# Patient Record
Sex: Female | Born: 1965
Health system: Southern US, Community
[De-identification: ages and names within clinical notes are randomized; demographics above are authoritative.]

## PROBLEM LIST (undated history)

## (undated) DIAGNOSIS — U071 COVID-19: Secondary | ICD-10-CM

## (undated) HISTORY — PX: ABDOMINAL HYSTERECTOMY: SHX81

## (undated) HISTORY — PX: TUBAL LIGATION: SHX77

## (undated) HISTORY — PX: WRIST SURGERY: SHX841

---

## 2014-01-06 ENCOUNTER — Emergency Department (HOSPITAL_COMMUNITY)
Admission: EM | Admit: 2014-01-06 | Discharge: 2014-01-06 | Disposition: A | Discharge status: Home / Self Care | Payer: Self-pay | Attending: Emergency Medicine | Admitting: Emergency Medicine

## 2014-01-06 ENCOUNTER — Emergency Department (HOSPITAL_COMMUNITY): Payer: Self-pay

## 2014-01-06 ENCOUNTER — Encounter (HOSPITAL_COMMUNITY): Payer: Self-pay | Admitting: Emergency Medicine

## 2014-01-06 DIAGNOSIS — R1084 Generalized abdominal pain: Secondary | ICD-10-CM | POA: Insufficient documentation

## 2014-01-06 DIAGNOSIS — M791 Myalgia: Secondary | ICD-10-CM | POA: Insufficient documentation

## 2014-01-06 DIAGNOSIS — R112 Nausea with vomiting, unspecified: Secondary | ICD-10-CM | POA: Insufficient documentation

## 2014-01-06 DIAGNOSIS — Z791 Long term (current) use of non-steroidal anti-inflammatories (NSAID): Secondary | ICD-10-CM | POA: Insufficient documentation

## 2014-01-06 DIAGNOSIS — R197 Diarrhea, unspecified: Secondary | ICD-10-CM | POA: Insufficient documentation

## 2014-01-06 DIAGNOSIS — Z3202 Encounter for pregnancy test, result negative: Secondary | ICD-10-CM | POA: Insufficient documentation

## 2014-01-06 DIAGNOSIS — R109 Unspecified abdominal pain: Secondary | ICD-10-CM

## 2014-01-06 LAB — URINALYSIS, ROUTINE W REFLEX MICROSCOPIC
Bilirubin Urine: NEGATIVE
Glucose, UA: NEGATIVE mg/dL
Hgb urine dipstick: NEGATIVE
Ketones, ur: NEGATIVE mg/dL
Leukocytes, UA: NEGATIVE
Nitrite: NEGATIVE
Protein, ur: NEGATIVE mg/dL
Specific Gravity, Urine: 1.009 (ref 1.005–1.030)
Urobilinogen, UA: 0.2 mg/dL (ref 0.0–1.0)
pH: 6.5 (ref 5.0–8.0)

## 2014-01-06 LAB — COMPREHENSIVE METABOLIC PANEL
ALT: 22 U/L (ref 0–35)
AST: 33 U/L (ref 0–37)
Albumin: 4 g/dL (ref 3.5–5.2)
Alkaline Phosphatase: 117 U/L (ref 39–117)
Anion gap: 12 (ref 5–15)
BUN: 21 mg/dL (ref 6–23)
CO2: 27 mEq/L (ref 19–32)
Calcium: 9.4 mg/dL (ref 8.4–10.5)
Chloride: 102 mEq/L (ref 96–112)
Creatinine, Ser: 0.76 mg/dL (ref 0.50–1.10)
GFR calc Af Amer: >90 mL/min (ref >90)
GFR calc non Af Amer: >90 mL/min (ref >90)
Glucose, Bld: 125 mg/dL — ABNORMAL HIGH (ref 70–99)
Potassium: 4 mEq/L (ref 3.7–5.3)
Sodium: 141 mEq/L (ref 137–147)
Total Bilirubin: 0.3 mg/dL (ref 0.3–1.2)
Total Protein: 7.3 g/dL (ref 6.0–8.3)

## 2014-01-06 LAB — CBC WITH DIFFERENTIAL/PLATELET
Basophils Absolute: 0 10*3/uL (ref 0.0–0.1)
Basophils Relative: 1 % (ref 0–1)
Eosinophils Absolute: 0.1 10*3/uL (ref 0.0–0.7)
Eosinophils Relative: 2 % (ref 0–5)
HCT: 41.2 % (ref 36.0–46.0)
Hemoglobin: 14.2 g/dL (ref 12.0–15.0)
Lymphocytes Relative: 14 % (ref 12–46)
Lymphs Abs: 0.9 10*3/uL (ref 0.7–4.0)
MCH: 28.7 pg (ref 26.0–34.0)
MCHC: 34.5 g/dL (ref 30.0–36.0)
MCV: 83.4 fL (ref 78.0–100.0)
Monocytes Absolute: 0.3 10*3/uL (ref 0.1–1.0)
Monocytes Relative: 4 % (ref 3–12)
Neutro Abs: 5.3 10*3/uL (ref 1.7–7.7)
Neutrophils Relative %: 79 % — ABNORMAL HIGH (ref 43–77)
Platelets: 210 10*3/uL (ref 150–400)
RBC: 4.94 MIL/uL (ref 3.87–5.11)
RDW: 12.2 % (ref 11.5–15.5)
WBC: 6.6 10*3/uL (ref 4.0–10.5)

## 2014-01-06 LAB — LIPASE, BLOOD: Lipase: 28 U/L (ref 11–59)

## 2014-01-06 LAB — POC URINE PREG, ED: Preg Test, Ur: NEGATIVE

## 2014-01-06 MED ORDER — ONDANSETRON HCL 4 MG/2ML IJ SOLN
4.0000 mg | Freq: Once | INTRAMUSCULAR | Status: AC
  Administered 2014-01-06: 4 mg via INTRAVENOUS
  Filled 2014-01-06: qty 2

## 2014-01-06 MED ORDER — IOHEXOL 300 MG/ML  SOLN
100.0000 mL | Freq: Once | INTRAMUSCULAR | Status: AC | PRN
  Administered 2014-01-06: 100 mL via INTRAVENOUS

## 2014-01-06 MED ORDER — IOHEXOL 300 MG/ML  SOLN
25.0000 mL | INTRAMUSCULAR | Status: AC | PRN
  Administered 2014-01-06 (×2): 25 mL via ORAL

## 2014-01-06 MED ORDER — ONDANSETRON HCL 4 MG PO TABS: 4.0000 mg | ORAL_TABLET | Freq: Four times a day (QID) | ORAL | Status: DC

## 2014-01-06 MED ORDER — SODIUM CHLORIDE 0.9 % IV BOLUS (SEPSIS)
1000.0000 mL | Freq: Once | INTRAVENOUS | Status: AC
  Administered 2014-01-06: 1000 mL via INTRAVENOUS

## 2014-01-06 NOTE — ED Provider Notes (Signed)
CSN: [id number]     Arrival date & time 01/06/14  [id number] History   First MD Initiated Contact with Patient 01/06/14 [phone]     Chief Complaint  Patient presents with  . Emesis  . Abdominal Pain     (Consider location/radiation/quality/duration/timing/severity/associated sxs/prior Treatment) HPI Comments: Patient presents today with a chief complaint of nausea, vomiting, diarrhea, and abdominal pain.  She reports that she began having symptoms this morning.  She reports approximately 4-5 episodes of vomiting and is unsure how many episodes of diarrhea.  She denies any blood in her emesis or blood in the stool.  Patient reports that her abdominal pain is located in the epigastric area and does not radiate.  She is unable to further describe the pain.  She has not taken anything for pain prior to arrival.  She is also complaining of generalized body aches.  No fever, chills, chest pain, cough, constipation, or urinary symptoms.  No known sick contacts.     Patient speaks Punong.  Called [employer] phone line, but no interpretor was available in this language.  Patient speaks some Viatnamese so [employer] CommunicationsViatnamese phone interpreter used.    The history is provided by the patient. The history is limited by a language barrier. A language interpreter was used.    History reviewed. No pertinent past medical history. History reviewed. No pertinent past surgical history. History reviewed. No pertinent family history. History  Substance Use Topics  . Smoking status: Not on file  . Smokeless tobacco: Not on file  . Alcohol Use: Not on file   OB History   Grav Para Term Preterm Abortions TAB SAB Ect Mult Living                 Review of Systems  All other systems reviewed and are negative.     Allergies  Review of patient's allergies indicates no known allergies.  Home Medications   Prior to Admission medications   Medication Sig Start Date End Date Taking? Authorizing Provider   naproxen sodium (ANAPROX) 220 MG tablet Take 220 mg by mouth 2 (two) times daily as needed (for pain).   Yes Historical Provider, MD   BP 138/81  Pulse 68  Temp(Src) 97.7 F (36.5 C) (Oral)  Resp 32  SpO2 100% Physical Exam  Constitutional: She appears well-developed and well-nourished.  HENT:  Head: Normocephalic and atraumatic.  Mouth/Throat: Oropharynx is clear and moist.  Neck: Normal range of motion. Neck supple.  Cardiovascular: Normal rate, regular rhythm and normal heart sounds.   Pulmonary/Chest: Effort normal and breath sounds normal.  Abdominal: Soft. Bowel sounds are normal. She exhibits no distension and no mass. There is tenderness. There is no rebound and no guarding.  Diffuse abdominal tenderness to palpation  Musculoskeletal: Normal range of motion.  Neurological: She is alert. She has normal strength. No cranial nerve deficit or sensory deficit.  Skin: Skin is warm and dry.  Psychiatric: She has a normal mood and affect.  Nursing note and vitals reviewed.   ED Course  Procedures (including critical care time) Labs Review Labs Reviewed  CBC WITH DIFFERENTIAL  COMPREHENSIVE METABOLIC PANEL  LIPASE, BLOOD    Imaging Review No results found.   EKG Interpretation None     11:32 AM Reassessed patient.  Abdomen:  Soft with tenderness to palpation of the RLQ.  No rebound or guarding.  Will order Abdominal CT to r/o Appendicitis. MDM   Final diagnoses:  None   Patient presents today with  abdominal pain, nausea, vomiting, and diarrhea.  Symptoms began this morning.  Language barrier is present.  Patient speaks Punong and there is no interpretor available in this language.  She does speak some [place]namese, so a [place]namese interpretor was used.  Patient is afebrile.  Labs today unremarkable.  Urine pregnancy negative.  UA negative.  CT abdomen/pelvis showing possible mild Colitis, but otherwise negative.  Pain and nausea controlled at time of discharge.  Patient  tolerating PO liquids.  Feel that the patient is stable for discharge.  Return precautions given.      Santiago GladHeather Keani Gotcher, PA-C 01/08/14 1707  Enid SkeensJoshua M Zavitz, MD 01/12/14 [phone]

## 2014-01-06 NOTE — ED Notes (Addendum)
PT has belly pain and vomiting starting today. 3 loose stools and headache. No history of this.

## 2014-01-06 NOTE — ED Notes (Signed)
Patient transported to CT 

## 2015-02-17 ENCOUNTER — Ambulatory Visit (INDEPENDENT_AMBULATORY_CARE_PROVIDER_SITE_OTHER): Payer: Self-pay | Admitting: Family Medicine

## 2015-02-17 VITALS — BP 102/68 | HR 64 | Temp 97.9°F | Resp 20 | Ht <= 58 in | Wt 127.1 lb

## 2015-02-17 DIAGNOSIS — K5900 Constipation, unspecified: Secondary | ICD-10-CM

## 2015-02-17 DIAGNOSIS — R1084 Generalized abdominal pain: Secondary | ICD-10-CM

## 2015-02-17 DIAGNOSIS — K2990 Gastroduodenitis, unspecified, without bleeding: Secondary | ICD-10-CM

## 2015-02-17 DIAGNOSIS — N841 Polyp of cervix uteri: Secondary | ICD-10-CM

## 2015-02-17 DIAGNOSIS — K2970 Gastritis, unspecified, without bleeding: Secondary | ICD-10-CM

## 2015-02-17 LAB — POCT WET + KOH PREP
Trich by wet prep: ABSENT
Yeast by KOH: ABSENT
Yeast by wet prep: ABSENT

## 2015-02-17 LAB — POCT CBC
Granulocyte percent: 73 %G (ref 37–80)
HCT, POC: 43.5 % (ref 37.7–47.9)
Hemoglobin: 14.9 g/dL (ref 12.2–16.2)
Lymph, poc: 1.9 (ref 0.6–3.4)
MCH, POC: 28.1 pg (ref 27–31.2)
MCHC: 34.3 g/dL (ref 31.8–35.4)
MCV: 81.8 fL (ref 80–97)
MID (cbc): 0.1 (ref 0–0.9)
MPV: 7.3 fL (ref 0–99.8)
POC Granulocyte: 5.6 (ref 2–6.9)
POC LYMPH PERCENT: 25.3 %L (ref 10–50)
POC MID %: 1.7 %M (ref 0–12)
Platelet Count, POC: 265 10*3/uL (ref 142–424)
RBC: 5.31 M/uL (ref 4.04–5.48)
RDW, POC: 12.7 %
WBC: 7.7 10*3/uL (ref 4.6–10.2)

## 2015-02-17 LAB — POCT URINALYSIS DIP (MANUAL ENTRY)
Bilirubin, UA: NEGATIVE
Blood, UA: NEGATIVE
Glucose, UA: NEGATIVE
Ketones, POC UA: NEGATIVE
Nitrite, UA: NEGATIVE
Protein Ur, POC: NEGATIVE
Spec Grav, UA: 1.02
Urobilinogen, UA: 0.2
pH, UA: 5.5

## 2015-02-17 LAB — POC MICROSCOPIC URINALYSIS (UMFC): Mucus: ABSENT

## 2015-02-17 LAB — HEMOCCULT GUIAC POC 1CARD (OFFICE)
Card #1 Date: 12112016
Fecal Occult Blood, POC: NEGATIVE

## 2015-02-17 MED ORDER — DICYCLOMINE HCL 10 MG PO CAPS: 10.0000 mg | ORAL_CAPSULE | Freq: Three times a day (TID) | ORAL | Status: DC

## 2015-02-17 MED ORDER — LACTULOSE 20 GM/30ML PO SOLN: 15.0000 mL | Freq: Two times a day (BID) | ORAL | Status: DC

## 2015-02-17 MED ORDER — GI COCKTAIL ~~LOC~~
30.0000 mL | Freq: Once | ORAL | Status: AC
  Administered 2015-02-17: 30 mL via ORAL

## 2015-02-17 NOTE — Patient Instructions (Signed)
Food Choices for Gastroesophageal Reflux Disease, Adult When you have gastroesophageal reflux disease (GERD), the foods you eat and your eating habits are very important. Choosing the right foods can help ease the discomfort of GERD. WHAT GENERAL GUIDELINES DO I NEED TO FOLLOW?  Choose fruits, vegetables, whole grains, low-fat dairy products, and low-fat meat, fish, and poultry.  Limit fats such as oils, salad dressings, butter, nuts, and avocado.  Keep a food diary to identify foods that cause symptoms.  Avoid foods that cause reflux. These may be different for different people.  Eat frequent small meals instead of three large meals each day.  Eat your meals slowly, in a relaxed setting.  Limit fried foods.  Cook foods using methods other than frying.  Avoid drinking alcohol.  Avoid drinking large amounts of liquids with your meals.  Avoid bending over or lying down until 2-3 hours after eating. WHAT FOODS ARE NOT RECOMMENDED? The following are some foods and drinks that may worsen your symptoms: Vegetables Tomatoes. Tomato juice. Tomato and spaghetti sauce. Chili peppers. Onion and garlic. Horseradish. Fruits Oranges, grapefruit, and lemon (fruit and juice). Meats High-fat meats, fish, and poultry. This includes hot dogs, ribs, ham, sausage, salami, and bacon. Dairy Whole milk and chocolate milk. Sour cream. Cream. Butter. Ice cream. Cream cheese.  Beverages Coffee and tea, with or without caffeine. Carbonated beverages or energy drinks. Condiments Hot sauce. Barbecue sauce.  Sweets/Desserts Chocolate and cocoa. Donuts. Peppermint and spearmint. Fats and Oils High-fat foods, including [place] fries and potato chips. Other Vinegar. Strong spices, such as black pepper, white pepper, red pepper, cayenne, curry powder, cloves, ginger, and chili powder. The items listed above may not be a complete list of foods and beverages to avoid. Contact your dietitian for more  information.   This information is not intended to replace advice given to you by your health care provider. Make sure you discuss any questions you have with your health care provider.   Document Released: 02/23/2005 Document Revised: 03/16/2014 Document Reviewed: 12/28/2012 Elsevier Interactive Patient Education 2016 Elsevier Inc.  Vim D? Dy, Ng??i L?n (Gastritis, Adult) Vim d? dy l hi?n t??ng ?au nh?c v s?ng (vim) ? l?p nim m?c d? dy. Vim d? dy c th? pht tri?n nh? l m?t tnh tr?ng kh?i pht ??t ng?t (c?p tnh) ho?c ko di (m?n tnh). N?u vim d? dy khng ???c ?i?u tr?, b?nh c th? d?n ??n ch?y mu v lot d? dy.  NGUYN NHN Vim d? dy xu?t hi?n khi l?p nim m?c d? dy y?u ho?c b? t?n th??ng. Sau ?, d?ch tiu ha t? d? dy gy vim nim m?c d? dy b? suy y?u. Nim m?c d? dy c th? y?u ho?c b? t?n th??ng do nhi?m vi rt ho?c vi khu?n. M?t nhi?m trng do vi khu?n ph? bi?n l nhi?m trng do Helicobacter pylori. Vim d? dy c?ng c th? do u?ng r??u qu nhi?u, dng m?t s? lo?i thu?c nh?t ??nh ho?c c qu nhi?u axit trong d? dy. TRI?U CH?NG Trong m?t s? tr??ng h?p, khng c tri?u ch?ng. Khi c tri?u ch?ng, chng c th? bao g?m:  ?au ho?c c?m gic nng rt ? vng b?ng trn.  Bu?n nn.  Nn.  C?m gic kh ch?u do no sau khi ?n. CH?N ?ON Chuyn gia ch?m [place] s?c kh?e c th? nghi ng? b?n b? vim d? dy d?a vo cc tri?u ch?ng c?a b?n v khm th?c th?. ?? xc ??nh nguyn nhn vim d? dy, chuyn gia ch?m  [place] s?c kh?e c th? th?c hi?n nh? sau:  Xt nghi?m mu ho?c phn ?? ki?m tra vi khu?n H pylori.  Soi d? dy. M?t ?ng m?ng, m?m (?n n?i soi) ???c lu?n xu?ng th?c qu?n v vo d? dy. ?n n?i soi c g?n ?n v camera ? ??u. Chuyn gia ch?m [place] s?c kh?e s? d?ng ?n n?i soi ?? xem bn trong d? dy.  L?y m?u m (sinh thi?t) t? d? dy ?? ki?m tra d??i knh hi?n vi. ?I?U TR? Ty thu?c vo nguyn nhn gy vim d? dy, thu?c c th? ???c k ??n. N?u b?n b? nhi?m trng do vi khu?n,  ch?ng h?n nh? nhi?m trng do H pylori, khng sinh c th? ???c cho dng. N?u vim d? dy l do c qu nhi?u axit trong d? dy, thu?c ch?n H2 ho?c thu?c trung ha axit c th? ???c cho dng. Chuyn gia ch?m [place] s?c kh?e c th? khuy?n ngh? b?n nn ng?ng dng atpirin, ibuprofen ho?c thu?c khng vim khng c steroid khc (NSAID). H??NG D?N CH?M [place] T?I NH  Ch? s? d?ng thu?c khng c?n k toa ho?c thu?c c?n k toa theo ch? d?n c?a chuyn gia ch?m [place] s?c kh?e.  N?u b?n ???c cho dng thu?c khng sinh, hy dng theo ch? d?n. Dng h?t thu?c ngay c? khi b?n b?t ??u c?m th?y ?? h?n.  U?ng ?? n??c ?? gi? cho n??c ti?u trong ho?c vng nh?t.  [place] cc lo?i th?c ph?m v ?? u?ng lm cho cc tri?u ch?ng t?i t? h?n, ch?ng h?n nh?:  Caffeine ho?c ?? u?ng c c?n.  S c la.  B?c h ho?c v? b?c h.  T?i v hnh ty.  Th?c ?n [place].  Tri cy h? cam, ch?ng h?n nh? cam, chanh hay chanh ty.  Cc th?c ?n c c chua, ch?ng h?n nh? n??c x?t, ?t, salsa (n??c x?t [place]) v bnh pizza.  Cc lo?i th?c ?n chin xo v nhi?u ch?t bo.  ?n nh?ng b?a ?n nh?, th??ng xuyn h?n thay v cc b?a ?n l?n. HY NGAY L?P T?C ?I KHM N?U:  Phn c mu ?en ho?c ?? s?m.  B?n nn ra mu ho?c ch?t gi?ng nh? b?t c ph.  B?n khng th? gi? ch?t l?ng trong ng??i.  B?n b? ?au b?ng tr?m tr?ng h?n.  B?n b? s?t.  B?n khng c?m th?y kh?e h?n sau 1 tu?n.  B?n c b?t c? cu h?i ho?c th?c m?c no khc. ??M B?O B?N:  Hi?u cc h??ng d?n ny.  S? theo di tnh tr?ng c?a mnh.  S? yu c?u tr? gip ngay l?p t?c n?u b?n c?m th?y khng ?? ho?c tnh tr?ng tr?m tr?ng h?n.   Thng tin ny khng nh?m m?c ?ch thay th? cho l?i khuyn m chuyn gia ch?m [place] s?c kh?e ni v?i qu v?. Hy b?o ??m qu v? ph?i th?o lu?n b?t k? v?n ?? g m qu v? c v?i chuyn gia ch?m [place] s?c kh?e c?a qu v?.   Document Released: 02/23/2005 Document Revised: 10/26/2012 Elsevier Interactive Patient Education 2016 [employer].  To bn, Ng??i  l?n (Constipation, Adult) To bn l khi m?t ng??i ?i ??i ti?n t h?n ba l?n trong m?t tu?n, ??i ti?n kh kh?n, ho?c ??i ti?n ra phn kh, c?ng, ho?c to h?n bnh th??ng. Khi tu?i cng cao th cng d? b? to bn. Ch? ?? ?n thi?u ch?t x?, khng u?ng ?? n??c, v dng m?t s? lo?i thu?c nh?t ??nh c th? lm to  bo n?ng thm.  NGUYN NHN.   M?t s? lo?i thu?c nh?t ??nh nh? thu?c ch?ng tr?m c?m, thu?c gi?m ?au, thu?c c b? sung s?t, thu?c trung ha axit d?ch v?, v thu?c l?i ti?u.  M?t s? b?nh l nh?t ??nh nh? ti?u ???ng, h?i ch?ng ru?t kch thch (IBS), b?nh c?a tuy?n gip, ho?c tr?m c?m.  Khng u?ng ?? n??c.  Khng ?n ?? th?c ?n giu ch?t x?.  C?ng th?ng ho?c do ?i l?i.  t ho?t ??ng thn th? ho?c th? d?c.  Nh?n ?i ??i ti?n.  S? d?ng qu nhi?u thu?c nhu?n trng. D?U HI?U V TRI?U CH?NG   ?i ??i ti?n t h?n ba l?n m?i tu?n.  Ph?i r?n m?nh ?? ??i ti?n.  ??i ti?n ra phn c?ng, kh, ho?c to h?n bnh th??ng.  C?m th?y ??y b?ng ho?c ch??ng b?ng.  ?au ? vng b?ng d??i.  Khng c?m th?y tho?i mi sau khi ??i ti?n. CH?N ?ON  Chuyn gia ch?m [place] s?c kh?e c?a qu v? s? h?i v? b?nh s? v khm th?c th? cho qu v?. C th? c?n ki?m tra thm trong tr??ng h?p to bn n?ng. M?t s? ki?m tra c th? bao g?m:  Ch?p X quang c ch?t c?n quang ?? ki?m tra tr?c trng, ??i trng, v ?i khi c? ru?t non c?a qu v?.  N?i soi tr?c trng sigma ?? ki?m tra ph?n pha d??i c?a ??i trng.  Th? thu?t soi ??i trng ?? khm ton b? ??i trng. ?I?U TR?  ?i?u tr? ty thu?c vo m?c ?? tr?m tr?ng c?a ch?ng to bn v nguyn nhn gy to bn. ?i?u tr? thng qua ch? ?? ?n u?ng bao g?m u?ng nhi?u n??c h?n v ?n th?c ?n c nhi?u ch?t x? h?n. ?i?u tr? thng qua l?i s?ng c th? bao g?m vi?c t?p th? d?c th??ng xuyn. N?u nh?ng khuy?n ngh? v? ch? ?? ?n v l?i s?ng khng c tc d?ng, chuyn gia ch?m [place] s?c kh?e c th? khuy?n ngh? qu v? dng cc lo?i thu?c nhu?n trng khng c?n k ??n ?? gip qu v? ??i ti?n. C th? ph?i k ??n  thu?c c?n k ??n n?u thu?c khng c?n k ??n khng c tc d?ng.  H??NG D?N CH?M [place] T?I NH   ?n th?c ?n c nhi?u ch?t x? nh? tri cy, rau, ng? c?c nguyn h?t, v cc lo?i ??u.  H?n ch? th?c ?n c nhi?u ch?t bo v ???ng ch? bi?n s?n, ch?ng h?n khoai ty chin, bnh hamburger, bnh quy, k?o, v soda.  C th? dng th?c ph?m ch?c n?ng c b? sung ch?t x? vo kh?u ph?n ?n c?a qu v? n?u qu v? khng th? dng ?? ch?t x? t? th?c ?n.  U?ng ?? n??c ?? gi? cho n??c ti?u trong ho?c vng nh?t.  T?p th? d?c th??ng xuyn ho?c theo ch? d?n c?a chuyn gia ch?m [place] s?c kh?e.  Vo nh v? sinh ngay khi qu v? c nhu c?u. Khng nh?n ?i ??i ti?n.  Ch? s? d?ng thu?c khng c?n k ??n ho?c thu?c c?n k ??n theo ch? d?n c?a chuyn gia ch?m [place] s?c kh?e.Khng dng cc lo?i thu?c ch?ng to bn khc m khng bn b?c tr??c v?i chuyn gia ch?m [place] s?c kh?e. NGAY L?P T?C ?I KHM N?U:   ?i ??i ti?n ra mu ?? t??i.  Ch?ng to bn ko di h?n 4 ngy v tr?m tr?ng h?n.  Qu v? b? ?au b?ng ho?c ?au tr?c trng.  Phn c?a qu  v? m?ng, trng nh? bt ch.  Qu v? b? s?t cn khng r nguyn nhn. ??M B?O QU V?:   Hi?u r cc h??ng d?n ny.  S? theo di tnh tr?ng c?a mnh.  S? yu c?u tr? gip ngay l?p t?c n?u qu v? c?m th?y khng kh?e ho?c th?y tr?m tr?ng h?n.   Thng tin ny khng nh?m m?c ?ch thay th? cho l?i khuyn m chuyn gia ch?m [place] s?c kh?e ni v?i qu v?. Hy b?o ??m qu v? ph?i th?o lu?n b?t k? v?n ?? g m qu v? c v?i chuyn gia ch?m [place] s?c kh?e c?a qu v?.   Document Released: 06/10/2010 Document Revised: 03/16/2014 Elsevier Interactive Patient Education [employer]

## 2015-02-17 NOTE — Progress Notes (Signed)
Subjective:  This chart was scribed for Theresa Sorenson, MD by [facility], medical scribe at [facility].The patient was seen in exam room 02 and the patient's care was started at 12:20 PM.   Patient ID: Theresa Weaver, female    DOB: [birth year], 49 y.o.   MRN: [id number] Chief Complaint  Patient presents with  . Abdominal Pain    x 2 weeks-Keep her up at night  . Diarrhea    yesterday  . Emesis  . Back Pain   HPI HPI Comments: Theresa Weaver is a 49 y.o. female who presents to [facility] because of constant, burning, and generalized abdominal pain that radiates to her back, onset two weeks ago. Associated nausea, vomiting, and diarrhea x2. Her appetite has decreased. Pain is unchanged after BM. Taking tylenol, ibuprofen, probiotic, gasx and nexium. She has only taken one nexium. No urinary symptoms or pelvic pain. Last pelvic exam was last year which was normal. No longer having menstrual periods, last was last year. Did have an episode of mild colitis seen on CT last year. symptomatically treated. Pt speaks Pun[place] and last year in the ED the [employer] interpret[employer] phone line did not have and interpretor available for this language.  History reviewed. No pertinent past medical history. Past Surgical History  Procedure Laterality Date  . Abdominal hysterectomy     No current outpatient prescriptions on file prior to visit.   No current facility-administered medications on file prior to visit.   No Known Allergies History reviewed. No pertinent family history. Social History   Social History  . Marital Status: Married    Spouse Name: N/A  . Number of Children: N/A  . Years of Education: N/A   Social History Main Topics  . Smoking status: Never Smoker   . Smokeless tobacco: Never Used  . Alcohol Use: No  . Drug Use: No  . Sexual Activity: Not Asked   Other Topics Concern  . None   Social History Narrative    Review of Systems  Constitutional:  Positive for appetite change.  Gastrointestinal: Positive for nausea, vomiting, abdominal pain and diarrhea.  Genitourinary: Negative for dysuria, frequency and pelvic pain.  Musculoskeletal: Positive for back pain.      Objective:  BP 102/68 mmHg  Pulse 64  Temp(Src) 97.9 F (36.6 C) (Oral)  Resp 20  Ht  (1.473 m)  Wt 127 lb 2 oz (57.664 kg)  BMI 26.58 kg/m2  SpO2 98% Physical Exam  Constitutional: She is oriented to person, place, and time. She appears well-developed and well-nourished. No distress.  HENT:  Head: Normocephalic and atraumatic.  Eyes: Pupils are equal, round, and reactive to light.  Neck: Normal range of motion.  Cardiovascular: Normal rate, regular rhythm and normal heart sounds.  Exam reveals no friction rub.   No murmur heard. Pulmonary/Chest: Effort normal and breath sounds normal. No respiratory distress.  Abdominal:  Positive left greater than right CVA tenderness. Hypoactive bowel sounds. Diffuse abdominal tenderness worse bilateral lower quadrant. No rebounding  Musculoskeletal: Normal range of motion.  Neurological: She is alert and oriented to person, place, and time.  Skin: Skin is warm and dry.  Psychiatric: She has a normal mood and affect. Her behavior is normal.  Nursing note and vitals reviewed.     Results for orders placed or performed in visit on 02/17/15  POCT urinalysis dipstick  Result Value Ref Range   Color, UA yellow yellow  Clarity, UA clear clear   Glucose, UA negative negative   Bilirubin, UA negative negative   Ketones, POC UA negative negative   Spec Grav, UA 1.020    Blood, UA negative negative   pH, UA 5.5    Protein Ur, POC negative negative   Urobilinogen, UA 0.2    Nitrite, UA Negative Negative   Leukocytes, UA Trace (A) Negative  POCT Microscopic Urinalysis ([facility])  Result Value Ref Range   WBC,UR,HPF,POC None None WBC/hpf   RBC,UR,HPF,POC None None RBC/hpf   Bacteria Few (A) None, Too numerous to count     Mucus Absent Absent   Epithelial Cells, UR Per Microscopy Few (A) None, Too numerous to count cells/hpf  POCT CBC  Result Value Ref Range   WBC 7.7 4.6 - 10.2 K/uL   Lymph, poc 1.9 0.6 - 3.4   POC LYMPH PERCENT 25.3 10 - 50 %L   MID (cbc) 0.1 0 - 0.9   POC MID % 1.7 0 - 12 %M   POC Granulocyte 5.6 2 - 6.9   Granulocyte percent 73.0 37 - 80 %G   RBC 5.31 4.04 - 5.48 M/uL   Hemoglobin 14.9 12.2 - 16.2 g/dL   HCT, POC 21.3 08.6 - 47.9 %   MCV 81.8 80 - 97 fL   MCH, POC 28.1 27 - 31.2 pg   MCHC 34.3 31.8 - 35.4 g/dL   RDW, POC 57.8 %   Platelet Count, POC 265 142 - 424 K/uL   MPV 7.3 0 - 99.8 fL  POCT Wet + KOH Prep  Result Value Ref Range   Yeast by KOH Absent Present, Absent   Yeast by wet prep Absent Present, Absent   WBC by wet prep Many (A) None, Few, Too numerous to count   Clue Cells Wet Prep HPF POC None None, Too numerous to count   Trich by wet prep Absent Present, Absent   Bacteria Wet Prep HPF POC Many (A) None, Few, Too numerous to count   Epithelial Cells By [employer] Pref ([facility]) Moderate (A) None, Few, Too numerous to count   RBC,UR,HPF,POC Many (A) None RBC/hpf  POCT occult blood stool  Result Value Ref Range   Fecal Occult Blood, POC Negative Negative   Card #1 Date [id number]    Card #2 Fecal Occult Blod, POC     Card #2 Date     Card #3 Fecal Occult Blood, POC     Card #3 Date      Assessment & Plan:   1. Generalized abdominal pain - suspect due to constipation, start miralax or lactulose. Trial of bentyl  2. Cervical polyp   3. Gastritis and gastroduodenitis   4. Constipation, unspecified constipation type    Language level caveat  Orders Placed This Encounter  Procedures  . POCT urinalysis dipstick  . POCT Microscopic Urinalysis ([facility])  .. t POCT CBC  . POCT Wet + KOH Prep  . POCT occult blood stool    Meds ordered this encounter  Medications  . esomeprazole (NEXIUM 24HR) 20 MG capsule    Sig: Take 20 mg by mouth daily at 12 noon.  .  Lactobacillus (EQL DIGESTIVE PROBIOTIC PO)    Sig: Take 1 capsule by mouth daily.  Marland Kitchen gi cocktail (Maalox,Lidocaine,Donnatal)    Sig:   . DISCONTD: Lactulose 20 GM/30ML SOLN    Sig: Take 15 mLs (10 g total) by mouth 2 (two) times daily. For constipation    Dispense:  300 mL  Refill:  0  . DISCONTD: dicyclomine (BENTYL) 10 MG capsule    Sig: Take 1 capsule (10 mg total) by mouth 4 (four) times daily -  before meals and at bedtime.    Dispense:  60 capsule    Refill:  0  . dicyclomine (BENTYL) 10 MG capsule    Sig: Take 1 capsule (10 mg total) by mouth 4 (four) times daily -  before meals and at bedtime.    Dispense:  60 capsule    Refill:  0  . Lactulose 20 GM/30ML SOLN    Sig: Take 15 mLs (10 g total) by mouth 2 (two) times daily. For constipation    Dispense:  600 mL    Refill:  0    I personally performed the services described in this documentation, which was scribed in my presence. The recorded information has been reviewed and considered, and addended by me as needed.  Theresa SorensonEva Shaw, MD MPH    By signing my name below, I, Nadim Abuhashem, attest that this documentation has been prepared under the direction and in the presence of Theresa SorensonEva Shaw, MD.  Electronically Signed: Conchita ParisNadim Abuhashem, medical scribe. 02/17/2015, 12:30 PM.

## 2016-08-10 ENCOUNTER — Ambulatory Visit (HOSPITAL_COMMUNITY)
Admission: RE | Admit: 2016-08-10 | Discharge: 2016-08-10 | Disposition: A | Discharge status: Home / Self Care | Payer: BLUE CROSS/BLUE SHIELD | Source: Ambulatory Visit | Attending: Emergency Medicine | Admitting: Emergency Medicine

## 2016-08-10 ENCOUNTER — Ambulatory Visit (INDEPENDENT_AMBULATORY_CARE_PROVIDER_SITE_OTHER): Payer: BLUE CROSS/BLUE SHIELD | Admitting: Emergency Medicine

## 2016-08-10 ENCOUNTER — Encounter: Payer: Self-pay | Admitting: Emergency Medicine

## 2016-08-10 ENCOUNTER — Telehealth: Payer: Self-pay | Admitting: Emergency Medicine

## 2016-08-10 VITALS — BP 140/86 | HR 67 | Temp 97.6°F | Resp 18 | Ht 58.07 in | Wt 135.4 lb

## 2016-08-10 DIAGNOSIS — R1032 Left lower quadrant pain: Secondary | ICD-10-CM | POA: Insufficient documentation

## 2016-08-10 DIAGNOSIS — R1084 Generalized abdominal pain: Secondary | ICD-10-CM

## 2016-08-10 DIAGNOSIS — K8020 Calculus of gallbladder without cholecystitis without obstruction: Secondary | ICD-10-CM | POA: Diagnosis not present

## 2016-08-10 DIAGNOSIS — R10813 Right lower quadrant abdominal tenderness: Secondary | ICD-10-CM | POA: Insufficient documentation

## 2016-08-10 DIAGNOSIS — R918 Other nonspecific abnormal finding of lung field: Secondary | ICD-10-CM | POA: Insufficient documentation

## 2016-08-10 LAB — POCT URINALYSIS DIP (MANUAL ENTRY)
Bilirubin, UA: NEGATIVE
Blood, UA: NEGATIVE
Glucose, UA: NEGATIVE mg/dL
Ketones, POC UA: NEGATIVE mg/dL
Leukocytes, UA: NEGATIVE
Nitrite, UA: NEGATIVE
Protein Ur, POC: NEGATIVE mg/dL
Spec Grav, UA: 1.02 (ref 1.010–1.025)
Urobilinogen, UA: 0.2 E.U./dL
pH, UA: 5.5 (ref 5.0–8.0)

## 2016-08-10 LAB — POCT CBC
Granulocyte percent: 64.2 %G (ref 37–80)
HCT, POC: 40.9 % (ref 37.7–47.9)
Hemoglobin: 14.6 g/dL (ref 12.2–16.2)
Lymph, poc: 1.8 (ref 0.6–3.4)
MCH, POC: 28.5 pg (ref 27–31.2)
MCHC: 35.6 g/dL — AB (ref 31.8–35.4)
MCV: 80.1 fL (ref 80–97)
MID (cbc): 0.3 (ref 0–0.9)
MPV: 7.6 fL (ref 0–99.8)
POC Granulocyte: 3.9 (ref 2–6.9)
POC LYMPH PERCENT: 30.8 %L (ref 10–50)
POC MID %: 5 %M (ref 0–12)
Platelet Count, POC: 255 10*3/uL (ref 142–424)
RBC: 5.11 M/uL (ref 4.04–5.48)
RDW, POC: 12.5 %
WBC: 6 10*3/uL (ref 4.6–10.2)

## 2016-08-10 LAB — POC MICROSCOPIC URINALYSIS (UMFC): Mucus: ABSENT

## 2016-08-10 LAB — COMPREHENSIVE METABOLIC PANEL
ALT: 25 IU/L (ref 0–32)
AST: 29 IU/L (ref 0–40)
Albumin/Globulin Ratio: 1.7 (ref 1.2–2.2)
Albumin: 4.7 g/dL (ref 3.5–5.5)
Alkaline Phosphatase: 127 IU/L — ABNORMAL HIGH (ref 39–117)
BUN/Creatinine Ratio: 16 (ref 9–23)
BUN: 14 mg/dL (ref 6–24)
Bilirubin Total: 0.5 mg/dL (ref 0.0–1.2)
CO2: 21 mmol/L (ref 18–29)
Calcium: 9.3 mg/dL (ref 8.7–10.2)
Chloride: 105 mmol/L (ref 96–106)
Creatinine, Ser: 0.88 mg/dL (ref 0.57–1.00)
GFR calc Af Amer: 87 mL/min/{1.73_m2} (ref >59)
GFR calc non Af Amer: 76 mL/min/{1.73_m2} (ref >59)
Globulin, Total: 2.7 g/dL (ref 1.5–4.5)
Glucose: 87 mg/dL (ref 65–99)
Potassium: 3.8 mmol/L (ref 3.5–5.2)
Sodium: 143 mmol/L (ref 134–144)
Total Protein: 7.4 g/dL (ref 6.0–8.5)

## 2016-08-10 MED ORDER — IOPAMIDOL (ISOVUE-300) INJECTION 61%
INTRAVENOUS | Status: AC
  Filled 2016-08-10: qty 100

## 2016-08-10 MED ORDER — IOPAMIDOL (ISOVUE-300) INJECTION 61%
100.0000 mL | Freq: Once | INTRAVENOUS | Status: AC | PRN
  Administered 2016-08-10: 100 mL via INTRAVENOUS

## 2016-08-10 NOTE — Telephone Encounter (Signed)
Spoke to both patient and daughter; CT A/P normal; advised to stay on clear liquid diet x 24 hours and advance as tolerated afterwards; Tylenol x pain and return if worse.

## 2016-08-10 NOTE — Patient Instructions (Signed)
GO TO [place] AT 3PM FOR CT SCAN  [address]  DRINK ONE BOTTLE AT 1PM  DRINK ONE BOTTLE AT 2PM

## 2016-08-10 NOTE — Progress Notes (Signed)
Theresa Weaver 51 y.o.   Chief Complaint  Patient presents with  . Abdominal Pain    X3 days- left side- nausea  . Dizziness    X 1 day with headache    HISTORY OF PRESENT ILLNESS: This is a 51 y.o. female complaining of abdominal pain since last Friday with nausea and vomiting.  Abdominal Pain  This is a new problem. The current episode started in the past 7 days. The onset quality is gradual. The problem occurs constantly. The problem has been waxing and waning. The pain is located in the LLQ, RLQ and epigastric region. The pain is at a severity of 4/10. The pain is moderate. The quality of the pain is dull and aching. The abdominal pain does not radiate. Associated symptoms include anorexia, diarrhea, a fever, nausea and vomiting. Pertinent negatives include no dysuria, frequency, headaches, hematochezia, hematuria, melena, myalgias or weight loss. There is no history of abdominal surgery, gallstones or pancreatitis.     Prior to Admission medications   Medication Sig Start Date End Date Taking? Authorizing Provider  dicyclomine (BENTYL) 10 MG capsule Take 1 capsule (10 mg total) by mouth 4 (four) times daily -  before meals and at bedtime. Patient not taking: Reported on 08/10/2016 02/17/15   Sherren MochaShaw, Eva N, MD  esomeprazole (NEXIUM 24HR) 20 MG capsule Take 20 mg by mouth daily at 12 noon.    [provider]  Lactobacillus (EQL DIGESTIVE PROBIOTIC PO) Take 1 capsule by mouth daily.    [provider]  Lactulose 20 GM/30ML SOLN Take 15 mLs (10 g total) by mouth 2 (two) times daily. For constipation Patient not taking: Reported on 08/10/2016 02/17/15   Sherren MochaShaw, Eva N, MD    No Known Allergies  There are no active problems to display for this patient.   History reviewed. No pertinent past medical history.  Past Surgical History:  Procedure Laterality Date  . ABDOMINAL HYSTERECTOMY      Social History   Social History  . Marital status: Married    Spouse name: N/A   . Number of children: N/A  . Years of education: N/A   Occupational History  . Not on file.   Social History Main Topics  . Smoking status: Never Smoker  . Smokeless tobacco: Never Used  . Alcohol use No  . Drug use: No  . Sexual activity: Not on file   Other Topics Concern  . Not on file   Social History Narrative  . No narrative on file    History reviewed. No pertinent family history.   Review of Systems  Constitutional: Positive for fever. Negative for weight loss.  HENT: Negative.   Eyes: Negative.   Respiratory: Negative.  Negative for cough and shortness of breath.   Cardiovascular: Negative.  Negative for chest pain, palpitations and leg swelling.  Gastrointestinal: Positive for abdominal pain, anorexia, diarrhea, nausea and vomiting. Negative for hematochezia and melena.  Genitourinary: Negative for dysuria, flank pain, frequency, hematuria and urgency.  Musculoskeletal: Negative for myalgias and neck pain.  Skin: Negative for rash.  Neurological: Negative for dizziness and headaches.  Endo/Heme/Allergies: Negative.   All other systems reviewed and are negative.    Physical Exam  Constitutional: She is oriented to person, place, and time. She appears well-developed and well-nourished.  HENT:  Head: Normocephalic and atraumatic.  Nose: Nose normal.  Mouth/Throat: Oropharynx is clear and moist.  Eyes: Conjunctivae and EOM are normal. Pupils are equal, round, and reactive to light.  Neck: Normal range of motion. Neck supple. No JVD present.  Cardiovascular: Normal rate, regular rhythm and normal heart sounds.   Pulmonary/Chest: Effort normal and breath sounds normal.  Abdominal: Soft. She exhibits no distension and no mass. There is tenderness (tender epigastric, LLQ, and RLQ). There is guarding (at RLQ). There is no rebound. No hernia.  Musculoskeletal: Normal range of motion.  Lymphadenopathy:    She has no cervical adenopathy.  Neurological: She is  alert and oriented to person, place, and time. No sensory deficit. She exhibits normal muscle tone.  Skin: Skin is warm and dry. Capillary refill takes less than 2 seconds.  Psychiatric: She has a normal mood and affect. Her behavior is normal.  Vitals reviewed.    ASSESSMENT & PLAN: [name] was seen today for abdominal pain and dizziness.  Diagnoses and all orders for this visit:  Left lower quadrant pain -     POCT urinalysis dipstick -     POCT Microscopic Urinalysis ([facility])  Generalized abdominal pain -     POCT CBC -     Comprehensive metabolic panel -     CT Abdomen Pelvis W Contrast; Future  Right lower quadrant abdominal tenderness without rebound tenderness   Patient Instructions  GO TO [facility] AT 3PM FOR CT SCAN  [address]  DRINK ONE BOTTLE AT 1PM  DRINK ONE BOTTLE AT 2PM   Edwina Barth, MD [facility] Vitals:   08/10/16 1100  BP: 140/86  Pulse: 67  Resp: 18  Temp: 97.6 F (36.4 C)   Recent Results (from the past 2160 hour(s))  POCT urinalysis dipstick     Status: None   Collection Time: 08/10/16 11:16 AM  Result Value Ref Range   Color, UA yellow yellow   Clarity, UA clear clear   Glucose, UA negative negative mg/dL   Bilirubin, UA negative negative   Ketones, POC UA negative negative mg/dL   Spec Grav, UA 1.610 9.604 - 1.025   Blood, UA negative negative   pH, UA 5.5 5.0 - 8.0   Protein Ur, POC negative negative mg/dL   Urobilinogen, UA 0.2 0.2 or 1.0 E.U./dL   Nitrite, UA Negative Negative   Leukocytes, UA Negative Negative  POCT Microscopic Urinalysis ([facility])     Status: Abnormal   Collection Time: 08/10/16 11:27 AM  Result Value Ref Range   WBC,UR,HPF,POC None None WBC/hpf   RBC,UR,HPF,POC None None RBC/hpf   Bacteria None None, Too numerous to count   Mucus Absent Absent   Epithelial Cells, UR Per Microscopy Few (A) None, Too numerous to count cells/hpf  POCT CBC     Status:  Abnormal   Collection Time: 08/10/16 11:30 AM  Result Value Ref Range   WBC 6.0 4.6 - 10.2 K/uL   Lymph, poc 1.8 0.6 - 3.4   POC LYMPH PERCENT 30.8 10 - 50 %L   MID (cbc) 0.3 0 - 0.9   POC MID % 5.0 0 - 12 %M   POC Granulocyte 3.9 2 - 6.9   Granulocyte percent 64.2 37 - 80 %G   RBC 5.11 4.04 - 5.48 M/uL   Hemoglobin 14.6 12.2 - 16.2 g/dL   HCT, POC 54.0 98.1 - 47.9 %   MCV 80.1 80 - 97 fL   MCH, POC 28.5 27 - 31.2 pg   MCHC 35.6 (A) 31.8 - 35.4 g/dL   RDW, POC 19.1 %   Platelet Count, POC 255 142 -  424 K/uL   MPV 7.6 0 - 99.8 fL

## 2016-08-12 ENCOUNTER — Other Ambulatory Visit: Payer: Self-pay | Admitting: Emergency Medicine

## 2016-08-12 DIAGNOSIS — K8020 Calculus of gallbladder without cholecystitis without obstruction: Secondary | ICD-10-CM

## 2016-08-15 NOTE — Progress Notes (Signed)
Unable to leave message

## 2016-09-02 ENCOUNTER — Encounter: Payer: Self-pay | Admitting: Radiology

## 2016-09-21 ENCOUNTER — Encounter: Payer: Self-pay | Admitting: Emergency Medicine

## 2016-10-17 ENCOUNTER — Ambulatory Visit (INDEPENDENT_AMBULATORY_CARE_PROVIDER_SITE_OTHER): Payer: BLUE CROSS/BLUE SHIELD | Admitting: Family Medicine

## 2016-10-17 ENCOUNTER — Ambulatory Visit (INDEPENDENT_AMBULATORY_CARE_PROVIDER_SITE_OTHER): Payer: BLUE CROSS/BLUE SHIELD

## 2016-10-17 ENCOUNTER — Encounter: Payer: Self-pay | Admitting: Family Medicine

## 2016-10-17 VITALS — BP 126/77 | HR 70 | Temp 97.5°F | Resp 16 | Ht <= 58 in | Wt 134.6 lb

## 2016-10-17 DIAGNOSIS — R109 Unspecified abdominal pain: Secondary | ICD-10-CM

## 2016-10-17 DIAGNOSIS — M25552 Pain in left hip: Secondary | ICD-10-CM

## 2016-10-17 DIAGNOSIS — R1032 Left lower quadrant pain: Secondary | ICD-10-CM

## 2016-10-17 DIAGNOSIS — K8021 Calculus of gallbladder without cholecystitis with obstruction: Secondary | ICD-10-CM

## 2016-10-17 LAB — POCT CBC
Granulocyte percent: 61.5 %G (ref 37–80)
HCT, POC: 43.2 % (ref 37.7–47.9)
Hemoglobin: 14.7 g/dL (ref 12.2–16.2)
Lymph, poc: 1.7 (ref 0.6–3.4)
MCH, POC: 27.9 pg (ref 27–31.2)
MCHC: 34.1 g/dL (ref 31.8–35.4)
MCV: 81.8 fL (ref 80–97)
MID (cbc): 0.3 (ref 0–0.9)
MPV: 7.6 fL (ref 0–99.8)
POC Granulocyte: 3.2 (ref 2–6.9)
POC LYMPH PERCENT: 32.5 %L (ref 10–50)
POC MID %: 6 %M (ref 0–12)
Platelet Count, POC: 268 10*3/uL (ref 142–424)
RBC: 5.28 M/uL (ref 4.04–5.48)
RDW, POC: 12.2 %
WBC: 5.2 10*3/uL (ref 4.6–10.2)

## 2016-10-17 LAB — POC MICROSCOPIC URINALYSIS (UMFC)

## 2016-10-17 LAB — POCT URINE PREGNANCY: Preg Test, Ur: NEGATIVE

## 2016-10-17 NOTE — Patient Instructions (Addendum)
Your x-rays showed some stool (poop) and gas in the intestines. The blood tests and urine tests are normal. One other blood test when off to a commercial lab and the reports will be back in a few days. We will let you know the results of them.  I would like you to take some MiraLAX, one dose every day until your stool is a little on the loose side to clean out your intestines and see if that helps your pain. (We are giving samples of MiraLAX. You can buy more over-the-counter if necessary and use it from time to time if you are having more problems.  The CT scan of the abdomen that you had 2 months ago did show that you have some gallstones. Many people will have gallstones that do not cause any symptoms or problems. However if you start having more pain, in the right side of the abdomen especially, you may have to see a surgeon about the gallbladder. I do not think that is necessary at this time since your symptoms are on the left side.  The hip x-ray looks normal. I hope that cleaning out your intestine and removing some of the pressure from the lower part of the abdomen will help the hip pain also.   You can continue to take Tylenol extra strength 500 mg (acetaminophen) 2 pills 3 times daily if necessary for the pain.  Also you can take ibuprofen 200 mg (Motrin, Advil) 2 or 3 pills 3 times a day with food if necessary for pain.  If you're not doing better over the next 1-2 weeks please return, or if you doing worse at anytime.  Go to the emergency room for further treatment if necessary if you cannot get in to the office.    IF you received an x-ray today, you will receive an invoice from [facility]. Please contact [facility] at [phone] with questions or concerns regarding your invoice.   IF you received labwork today, you will receive an invoice from [facility]. Please contact [facility] at [phone] with questions or concerns regarding your invoice.   Our billing  staff will not be able to assist you with questions regarding bills from these companies.  You will be contacted with the lab results as soon as they are available. The fastest way to get your results is to activate your My Chart account. Instructions are located on the last page of this paperwork. If you have not heard from us regarding the results in 2 weeks, please contact this office.

## 2016-10-17 NOTE — Progress Notes (Signed)
Patient ID: Theresa Weaver, female    DOB: 01-06-1966  Age: 51 y.o. MRN: [id number]  Chief Complaint  Patient presents with  . Back Pain    x 1 wk/ pt was lifiting something heavy  . Abdominal Pain    x 1wk    Subjective:   51 year old [place] lady who is here with abdominal pain and flank pain over the last week. She also complains of hurting her left hip. 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 She does not know of any specific injury. She denies nausea or vomiting. Bowels been acting okay. No dysuria. She has had problems in June and had a CT scan of the abdomen. No acute findings were present, though she does have some asymmetry medical cholelithiasis. She said she maybe it had some fevers.  Current allergies, medications, problem list, past/family and social histories reviewed.  Objective:  BP 126/77   Pulse 70   Temp (!) 97.5 F (36.4 C) (Oral)   Resp 16   Ht 4\' 10"  (1.473 m)   Wt 134 lb 9.6 oz (61.1 kg)   SpO2 100%   BMI 28.13 kg/m   No acute distress. Chest clear to auscultation. Heart regular. Abdomen has diminished bowel sounds. Soft. Mild to moderate left lower quadrant tenderness along the sigmoid region. Very mild tenderness right upper quadrant on deep palpation. Mild left CVA tenderness. Mild tenderness of the very low lumbar spine. Good range of motion of the hip with no crepitance noted.  Hold report reviewed. Urinalysis was normal and CT was normal except the presence of cholelithiasis.  Assessment & Plan:   Assessment: 1. LLQ abdominal pain   2. Left hip pain   3. Calculus of gallbladder with biliary obstruction but without cholecystitis   4. Left flank pain       Plan: Check labs and an x-ray of the abdomen and of the hip  Orders Placed This Encounter  Procedures  . DG Abd 2 Views    Standing Status:   Future    Number of Occurrences:   1    Standing Expiration Date:   10/17/2017    Order Specific Question:   Reason for Exam (SYMPTOM   OR DIAGNOSIS REQUIRED)    Answer:   llq abdominal pain; left flank pain; left hip pain    Order Specific Question:   Is the patient pregnant?    Answer:   No    Order Specific Question:   Preferred imaging location?    Answer:   External  . DG HIP UNILAT W OR W/O PELVIS 2-3 VIEWS LEFT    Standing Status:   Future    Number of Occurrences:   1    Standing Expiration Date:   10/17/2017    Order Specific Question:   Reason for Exam (SYMPTOM  OR DIAGNOSIS REQUIRED)    Answer:   llq abdominal pain; left flank pain; left hip pain    Order Specific Question:   Is the patient pregnant?    Answer:   No    Order Specific Question:   Preferred imaging location?    Answer:   External  . Comprehensive metabolic panel  . POCT Microscopic Urinalysis ([facility])  . POCT urine pregnancy  . POCT CBC    Meds ordered this encounter  Medications  . acetaminophen (TYLENOL) 325 MG tablet    Sig: Take 650 mg by mouth every 6 (six) hours as needed.   Results for orders placed or  performed in visit on 10/17/16  POCT Microscopic Urinalysis ([facility])  Result Value Ref Range   WBC,UR,HPF,POC None None WBC/hpf   RBC,UR,HPF,POC None None RBC/hpf   Bacteria None None, Too numerous to count   Mucus Present (A) Absent   Epithelial Cells, UR Per Microscopy Few (A) None, Too numerous to count cells/hpf  POCT urine pregnancy  Result Value Ref Range   Preg Test, Ur Negative Negative  POCT CBC  Result Value Ref Range   WBC 5.2 4.6 - 10.2 K/uL   Lymph, poc 1.7 0.6 - 3.4   POC LYMPH PERCENT 32.5 10 - 50 %L   MID (cbc) 0.3 0 - 0.9   POC MID % 6.0 0 - 12 %M   POC Granulocyte 3.2 2 - 6.9   Granulocyte percent 61.5 37 - 80 %G   RBC 5.28 4.04 - 5.48 M/uL   Hemoglobin 14.7 12.2 - 16.2 g/dL   HCT, POC 16.1 09.6 - 47.9 %   MCV 81.8 80 - 97 fL   MCH, POC 27.9 27 - 31.2 pg   MCHC 34.1 31.8 - 35.4 g/dL   RDW, POC 04.5 %   Platelet Count, POC 268 142 - 424 K/uL   MPV 7.6 0 - 99.8 fL   X-ray: Hip report negative Abdomen:  Nonobstructive gas pattern      Patient Instructions   Your x-rays showed some stool (poop) and gas in the intestines. The blood tests and urine tests are normal. One other blood test when off to a commercial lab and the reports will be back in a few days. We will let you know the results of them.  I would like you to take some MiraLAX, one dose every day until your stool is a little on the loose side to clean out your intestines and see if that helps your pain. (We are giving samples of MiraLAX. You can buy more over-the-counter if necessary and use it from time to time if you are having more problems.  The CT scan of the abdomen that you had 2 months ago did show that you have some gallstones. Many people will have gallstones that do not cause any symptoms or problems. However if you start having more pain, in the right side of the abdomen especially, you may have to see a surgeon about the gallbladder. I do not think that is necessary at this time since your symptoms are on the left side.  The hip x-ray looks normal. I hope that cleaning out your intestine and removing some of the pressure from the lower part of the abdomen will help the hip pain also.   You can continue to take Tylenol extra strength 500 mg (acetaminophen) 2 pills 3 times daily if necessary for the pain.  Also you can take ibuprofen 200 mg (Motrin, Advil) 2 or 3 pills 3 times a day with food if necessary for pain.  If you're not doing better over the next 1-2 weeks please return, or if you doing worse at anytime.  Go to the emergency room for further treatment if necessary if you cannot get in to the office.    IF you received an x-ray today, you will receive an invoice from [facility]. Please contact [facility] at [phone] with questions or concerns regarding your invoice.   IF you received labwork today, you will receive an invoice from [facility]. Please contact [facility] at [phone] with  questions or concerns regarding your invoice.   Our billing  staff will not be able to assist you with questions regarding bills from these companies.  You will be contacted with the lab results as soon as they are available. The fastest way to get your results is to activate your My Chart account. Instructions are located on the last page of this paperwork. If you have not heard from us regarding the results in 2 weeks, please contact this office.         Return if symptoms worsen or fail to improve.   Machele Deihl, MD 10/17/2016

## 2016-10-18 LAB — COMPREHENSIVE METABOLIC PANEL
ALT: 32 IU/L (ref 0–32)
AST: 37 IU/L (ref 0–40)
Albumin/Globulin Ratio: 1.5 (ref 1.2–2.2)
Albumin: 4.7 g/dL (ref 3.5–5.5)
Alkaline Phosphatase: 140 IU/L — ABNORMAL HIGH (ref 39–117)
BUN/Creatinine Ratio: 16 (ref 9–23)
BUN: 13 mg/dL (ref 6–24)
Bilirubin Total: 0.6 mg/dL (ref 0.0–1.2)
CO2: 24 mmol/L (ref 20–29)
Calcium: 9.6 mg/dL (ref 8.7–10.2)
Chloride: 104 mmol/L (ref 96–106)
Creatinine, Ser: 0.8 mg/dL (ref 0.57–1.00)
GFR calc Af Amer: 98 mL/min/{1.73_m2} (ref >59)
GFR calc non Af Amer: 85 mL/min/{1.73_m2} (ref >59)
Globulin, Total: 3.1 g/dL (ref 1.5–4.5)
Glucose: 76 mg/dL (ref 65–99)
Potassium: 4.4 mmol/L (ref 3.5–5.2)
Sodium: 142 mmol/L (ref 134–144)
Total Protein: 7.8 g/dL (ref 6.0–8.5)

## 2016-10-19 ENCOUNTER — Encounter (HOSPITAL_COMMUNITY): Payer: Self-pay | Admitting: Emergency Medicine

## 2016-10-19 ENCOUNTER — Emergency Department (HOSPITAL_COMMUNITY)
Admission: EM | Admit: 2016-10-19 | Discharge: 2016-10-20 | Disposition: A | Discharge status: Home / Self Care | Payer: BLUE CROSS/BLUE SHIELD | Attending: Emergency Medicine | Admitting: Emergency Medicine

## 2016-10-19 ENCOUNTER — Encounter: Payer: Self-pay | Admitting: *Deleted

## 2016-10-19 ENCOUNTER — Emergency Department (HOSPITAL_COMMUNITY): Payer: BLUE CROSS/BLUE SHIELD

## 2016-10-19 DIAGNOSIS — R1084 Generalized abdominal pain: Secondary | ICD-10-CM | POA: Diagnosis not present

## 2016-10-19 DIAGNOSIS — R109 Unspecified abdominal pain: Secondary | ICD-10-CM | POA: Diagnosis present

## 2016-10-19 DIAGNOSIS — Z79899 Other long term (current) drug therapy: Secondary | ICD-10-CM | POA: Insufficient documentation

## 2016-10-19 LAB — URINALYSIS, ROUTINE W REFLEX MICROSCOPIC
Bilirubin Urine: NEGATIVE
Glucose, UA: NEGATIVE mg/dL
Hgb urine dipstick: NEGATIVE
Ketones, ur: NEGATIVE mg/dL
Nitrite: NEGATIVE
Protein, ur: NEGATIVE mg/dL
Specific Gravity, Urine: 1.012 (ref 1.005–1.030)
pH: 5 (ref 5.0–8.0)

## 2016-10-19 LAB — COMPREHENSIVE METABOLIC PANEL
ALT: 29 U/L (ref 14–54)
AST: 32 U/L (ref 15–41)
Albumin: 4.3 g/dL (ref 3.5–5.0)
Alkaline Phosphatase: 116 U/L (ref 38–126)
Anion gap: 9 (ref 5–15)
BUN: 14 mg/dL (ref 6–20)
CO2: 25 mmol/L (ref 22–32)
Calcium: 9.4 mg/dL (ref 8.9–10.3)
Chloride: 105 mmol/L (ref 101–111)
Creatinine, Ser: 0.91 mg/dL (ref 0.44–1.00)
GFR calc Af Amer: >60 mL/min (ref >60)
GFR calc non Af Amer: >60 mL/min (ref >60)
Glucose, Bld: 103 mg/dL — ABNORMAL HIGH (ref 65–99)
Potassium: 4.1 mmol/L (ref 3.5–5.1)
Sodium: 139 mmol/L (ref 135–145)
Total Bilirubin: 0.6 mg/dL (ref 0.3–1.2)
Total Protein: 7.6 g/dL (ref 6.5–8.1)

## 2016-10-19 LAB — CBC
HCT: 42.3 % (ref 36.0–46.0)
Hemoglobin: 14 g/dL (ref 12.0–15.0)
MCH: 27.4 pg (ref 26.0–34.0)
MCHC: 33.1 g/dL (ref 30.0–36.0)
MCV: 82.8 fL (ref 78.0–100.0)
Platelets: 256 10*3/uL (ref 150–400)
RBC: 5.11 MIL/uL (ref 3.87–5.11)
RDW: 12.2 % (ref 11.5–15.5)
WBC: 6.7 10*3/uL (ref 4.0–10.5)

## 2016-10-19 LAB — LIPASE, BLOOD: Lipase: 31 U/L (ref 11–51)

## 2016-10-19 MED ORDER — IOPAMIDOL (ISOVUE-300) INJECTION 61%
INTRAVENOUS | Status: AC
  Administered 2016-10-19: 100 mL
  Filled 2016-10-19: qty 100

## 2016-10-19 MED ORDER — DICYCLOMINE HCL 10 MG PO CAPS
10.0000 mg | ORAL_CAPSULE | Freq: Once | ORAL | Status: AC
  Administered 2016-10-19: 10 mg via ORAL
  Filled 2016-10-19: qty 1

## 2016-10-19 MED ORDER — SODIUM CHLORIDE 0.9 % IV BOLUS (SEPSIS)
500.0000 mL | Freq: Once | INTRAVENOUS | Status: AC
  Administered 2016-10-19: 500 mL via INTRAVENOUS

## 2016-10-19 MED ORDER — DICYCLOMINE HCL 20 MG PO TABS: 20.0000 mg | ORAL_TABLET | Freq: Three times a day (TID) | ORAL | 0 refills | Status: DC | PRN

## 2016-10-19 NOTE — ED Triage Notes (Signed)
Pt arrives from home c/o ongoing lower abd pain.  Pt was seen at [facility] on 8/11 for same, dx with constipation.  Pt rpeorts taking miralax and having 3 BMs in past 24 hrs, no relief from pain.  Pt denies n/v, reports subjective fever.

## 2016-10-19 NOTE — Discharge Instructions (Signed)
The testing today is reassuring. The CT scan did not show any acute abnormalities.  You may have an intestinal disorder requiring treatment by a gastroenterologist. Call the listed doctor, to get an appointment to be seen for a checkup as soon as possible.

## 2016-10-19 NOTE — ED Provider Notes (Signed)
[facility]-EMERGENCY DEPT Provider Note   CSN: [id number] Arrival date & time: 10/19/16  1705     History   Chief Complaint Chief Complaint  Patient presents with  . Abdominal Pain    HPI Theresa Weaver is a 51 y.o. female.  She presents for evaluation of abdominal pain, present for 10 days. She saw a PCP at the urgent care center, 2 days ago, felt that she was constipated, so prescribed MiraLAX. She is taking MiraLAX once a day with bowel movements, since then. She denies diarrhea, blood in stool, vomiting, cough, chest pain or headache. The abdominal pain is diffuse, crampy and "severe." Abdominal pain radiates to her right flank. She denies dysuria, urinary frequency, or hematuria.  HPI  History reviewed. No pertinent past medical history.  Patient Active Problem List   Diagnosis Date Noted  . Left lower quadrant pain 08/10/2016  . Generalized abdominal pain 08/10/2016  . Right lower quadrant abdominal tenderness without rebound tenderness 08/10/2016    Past Surgical History:  Procedure Laterality Date  . ABDOMINAL HYSTERECTOMY      OB History    No data available       Home Medications    Prior to Admission medications   Medication Sig Start Date End Date Taking? Authorizing Provider  acetaminophen (TYLENOL) 325 MG tablet Take 650 mg by mouth every 6 (six) hours as needed.    [provider]  dicyclomine (BENTYL) 20 MG tablet Take 1 tablet (20 mg total) by mouth every 8 (eight) hours as needed (Intestinal cramping). 10/19/16   Mancel BaleWentz, Secily Walthour, MD  esomeprazole (NEXIUM 24HR) 20 MG capsule Take 20 mg by mouth daily at 12 noon.    [provider]  Lactobacillus (EQL DIGESTIVE PROBIOTIC PO) Take 1 capsule by mouth daily.    [provider]  Lactulose 20 GM/30ML SOLN Take 15 mLs (10 g total) by mouth 2 (two) times daily. For constipation Patient not taking: Reported on 08/10/2016 02/17/15   Sherren MochaShaw, Eva N, MD    Family History No family history on  file.  Social History Social History  Substance Use Topics  . Smoking status: Never Smoker  . Smokeless tobacco: Never Used  . Alcohol use No     Allergies   Patient has no known allergies.   Review of Systems Review of Systems  All other systems reviewed and are negative.    Physical Exam Updated Vital Signs BP 132/70   Pulse 63   Temp 97.6 F (36.4 C) (Oral)   Resp 14   Ht 4\' 10"  (1.473 m)   Wt 60.8 kg (134 lb)   SpO2 100%   BMI 28.01 kg/m   Physical Exam  Constitutional: She is oriented to person, place, and time. She appears well-developed and well-nourished. No distress.  HENT:  Head: Normocephalic and atraumatic.  Eyes: Pupils are equal, round, and reactive to light. Conjunctivae and EOM are normal.  Neck: Normal range of motion and phonation normal. Neck supple.  Cardiovascular: Normal rate and regular rhythm.   Pulmonary/Chest: Effort normal and breath sounds normal. She exhibits no tenderness.  Abdominal: Soft. She exhibits no distension and no mass. There is tenderness (Diffuse, moderate). There is no rebound and no guarding.  Musculoskeletal: Normal range of motion.  Neurological: She is alert and oriented to person, place, and time. She exhibits normal muscle tone.  Skin: Skin is warm and dry.  Psychiatric: She has a normal mood and affect. Her behavior is normal. Judgment and thought content  normal.  Nursing note and vitals reviewed.    ED Treatments / Results  Labs (all labs ordered are listed, but only abnormal results are displayed) Labs Reviewed  COMPREHENSIVE METABOLIC PANEL - Abnormal; Notable for the following:       Result Value   Glucose, Bld 103 (*)    All other components within normal limits  URINALYSIS, ROUTINE W REFLEX MICROSCOPIC - Abnormal; Notable for the following:    Leukocytes, UA MODERATE (*)    Bacteria, UA RARE (*)    Squamous Epithelial / LPF 0-5 (*)    Non Squamous Epithelial 0-5 (*)    All other components within  normal limits  LIPASE, BLOOD  CBC    EKG  EKG Interpretation None       Radiology Ct Abdomen Pelvis W Contrast  Result Date: 10/19/2016 CLINICAL DATA:  Lower abdominal pain for 2 weeks. Clinical concern for diverticulitis per EXAM: CT ABDOMEN AND PELVIS WITH CONTRAST TECHNIQUE: Multidetector CT imaging of the abdomen and pelvis was performed using the standard protocol following bolus administration of intravenous contrast. CONTRAST:  ISOVUE-300 IOPAMIDOL (ISOVUE-300) INJECTION 61% COMPARISON:  Abdominal CT 08/10/2016 FINDINGS: Lower chest: Cystic changes likely bronchiectasis in the right middle and right lower lobes, unchanged from prior exam. No focal airspace disease. No pleural fluid. Hepatobiliary: The gallstones on prior exam are not as well-defined currently. Abnormal gallbladder distention or pericholecystic inflammation. No focal hepatic lesion. No biliary dilatation. Pancreas: No ductal dilatation or inflammation. Spleen: Normal in size without focal abnormality. Small splenule at the hilum adjacent to the pancreatic tail. Adrenals/Urinary Tract: No adrenal nodule. No hydronephrosis or perinephric edema. Scattered low-density lesions the kidneys, too small to characterize, likely small cysts. These are unchanged from prior exam. Cortical scarring of the mid right kidney again seen. Homogeneous enhancement with symmetric excretion on delayed phase imaging. Urinary bladder is physiologically distended. Possible anterior small midline bladder diverticulum, unchanged from prior exam. Stomach/Bowel: No diverticulitis. No significant diverticular changes in the colon. There is no bowel inflammation, wall thickening or evidence for obstruction. Normal appendix. Vascular/Lymphatic: Mild aortic atherosclerosis. No aneurysm. No abdominal or pelvic adenopathy. Reproductive: Uterus and bilateral adnexa are unremarkable. Probable tubal ligation clips. Other: No free air, free fluid, or  intra-abdominal fluid collection. Tiny fat containing umbilical hernia. Musculoskeletal: There are no acute or suspicious osseous abnormalities. IMPRESSION: 1. No acute abnormality or explanation for abdominal pain. Particularly, no diverticulitis or significant diverticular changes. 2. Chronic changes include mild right renal scarring, aortic atherosclerosis, and probable cystic bronchiectasis in the right middle and lower lobes. Electronically Signed   By: Rubye Oaks M.D.   On: 10/19/2016 22:42    Procedures Procedures (including critical care time)  Medications Ordered in ED Medications  sodium chloride 0.9 % bolus 500 mL (500 mLs Intravenous New Bag/Given 10/19/16 2210)  dicyclomine (BENTYL) capsule 10 mg (10 mg Oral Given 10/19/16 2210)  iopamidol (ISOVUE-300) 61 % injection (100 mLs  Contrast Given 10/19/16 2220)     Initial Impression / Assessment and Plan / ED Course  I have reviewed the triage vital signs and the nursing notes.  Pertinent labs & imaging results that were available during my care of the patient were reviewed by me and considered in my medical decision making (see chart for details).      Patient Vitals for the past 24 hrs:  BP Temp Temp src Pulse Resp SpO2 Height Weight  10/19/16 2115 132/70 - - - - - - -  10/19/16  2015 124/85 97.6 F (36.4 C) Oral 63 14 100 % - -  10/19/16 1750 - - - - - - 4\' 10"  (1.473 m) 60.8 kg (134 lb)  10/19/16 1739 121/82 (!) 97.5 F (36.4 C) Oral 80 16 99 % - -    11:56 PM Reevaluation with update and discussion. After initial assessment and treatment, an updated evaluation reveals She is comfortable, has no further complaints. Findings discussed with the patient and all questions were answered. Draya Felker L     Final Clinical Impressions(s) / ED Diagnoses   Final diagnoses:  Generalized abdominal pain   Recurrent abdominal pain, no response to treatment for constipation with MiraLAX. No prior evaluation by  gastroenterology. CT abdomen is normal. Doubt cholecystitis, bowel obstruction, colitis, serious bacterial infection or impending vascular collapse.  Nursing Notes Reviewed/ Care Coordinated Applicable Imaging Reviewed Interpretation of Laboratory Data incorporated into ED treatment  The patient appears reasonably screened and/or stabilized for discharge and I doubt any other medical condition or other [facility] requiring further screening, evaluation, or treatment in the ED at this time prior to discharge.  Plan: Home Medications- continue usual medications. OTC analgesia when necessary; Home Treatments- rest, gradually advance diet; return here if the recommended treatment, does not improve the symptoms; Recommended follow up- PCP when necessary. Gastroenterology consult as soon as possible    New Prescriptions New Prescriptions   DICYCLOMINE (BENTYL) 20 MG TABLET    Take 1 tablet (20 mg total) by mouth every 8 (eight) hours as needed (Intestinal cramping).     Mancel Bale, MD 10/19/16 [phone]

## 2016-10-23 ENCOUNTER — Encounter: Payer: Self-pay | Admitting: Physician Assistant

## 2016-11-06 ENCOUNTER — Other Ambulatory Visit: Payer: BLUE CROSS/BLUE SHIELD

## 2016-11-06 ENCOUNTER — Encounter: Payer: Self-pay | Admitting: Physician Assistant

## 2016-11-06 ENCOUNTER — Ambulatory Visit (INDEPENDENT_AMBULATORY_CARE_PROVIDER_SITE_OTHER): Payer: BLUE CROSS/BLUE SHIELD | Admitting: Physician Assistant

## 2016-11-06 VITALS — BP 102/72 | HR 64 | Ht <= 58 in | Wt 133.2 lb

## 2016-11-06 DIAGNOSIS — R110 Nausea: Secondary | ICD-10-CM | POA: Diagnosis not present

## 2016-11-06 DIAGNOSIS — R12 Heartburn: Secondary | ICD-10-CM

## 2016-11-06 DIAGNOSIS — K8020 Calculus of gallbladder without cholecystitis without obstruction: Secondary | ICD-10-CM

## 2016-11-06 DIAGNOSIS — R197 Diarrhea, unspecified: Secondary | ICD-10-CM | POA: Diagnosis not present

## 2016-11-06 DIAGNOSIS — R1013 Epigastric pain: Secondary | ICD-10-CM | POA: Diagnosis not present

## 2016-11-06 MED ORDER — DICYCLOMINE HCL 20 MG PO TABS: 20.0000 mg | ORAL_TABLET | Freq: Three times a day (TID) | ORAL | 0 refills | Status: DC

## 2016-11-06 MED ORDER — OMEPRAZOLE 40 MG PO CPDR: 40.0000 mg | DELAYED_RELEASE_CAPSULE | Freq: Two times a day (BID) | ORAL | 1 refills | Status: DC

## 2016-11-06 NOTE — Progress Notes (Signed)
Chief Complaint: Abdominal pain, diarrhea and nausea  HPI:  [name] is a 51 year old [place] female who presents to clinic today accompanied by her daughter does translate for her, who was referred to me by Georgina QuintSagardia, Miguel Jose, * for a complaint of abdominal pain, diarrhea and nausea.      Per chart review patient was recently seen in the ER for abdominal pain 10/19/16, at that time she described seeing a PCP at the urgent care center 2 days prior who felt she was constipated so she prescribed MiraLAX. Patient had been using MiraLAX and having once a bowel movement since that time. She described her abdominal pain as diffuse, crampy and "severe" with radiation into her right flank. Labs including a CMP, urinalysis, lipase and CBC were completed and normal. CT abdomen and pelvis with contrast was completed with no acute abnormality or explanation for abdominal pain. Particularly, no diverticulitis or significant diverticular changes. Chronic changes included might mild right renal scarring, aortic atherosclerosis and probable cystic bronchiectasis in the right middle and lower lobes. Patient also had a finding of gallstones though not as well defined as prior exam in June.   Today, the patient presents to clinic accompanied by her daughter and they are poor historians. It is hard to distinguish exactly what the patient is talking about but she tells me that when she eats anything within 5-10 minutes she has a worsening abdominal pain which she describes as generalized but points to her epigastrium which radiates around to her sides and into her back. She describes this pain as "burning". She also describes some accompanying nausea and vomiting which has not occurred within the past few weeks. Patient has been using Dicyclomine 20 mg as needed and tells me that helps with this pain. She does note a change in her bowel movements recently to a watery stool just last night and this morning, with a few  episodes prior to this over the past few weeks. She did not use her MiraLAX yesterday but has been taking this daily before that time. Patient denies a history of similar symptoms. She denies change in diet or use of NSAIDs.   Patient denies fever, chills, blood in her stool, melena, weight loss, anorexia, further nausea or vomiting, dysphagia or symptoms that awaken her at night.  History reviewed. No pertinent past medical history.  Past Surgical History:  Procedure Laterality Date  . ABDOMINAL HYSTERECTOMY    . TUBAL LIGATION    . WRIST SURGERY Left     Current Outpatient Prescriptions  Medication Sig Dispense Refill  . dicyclomine (BENTYL) 20 MG tablet Take 1 tablet (20 mg total) by mouth every 8 (eight) hours as needed (Intestinal cramping). 30 tablet 0  . polyethylene glycol powder (GLYCOLAX/MIRALAX) powder Take 17 g by mouth as needed.     No current facility-administered medications for this visit.     Allergies as of 11/06/2016  . (No Known Allergies)    Family History  Problem Relation Age of Onset  . Family history unknown: Yes    Social History   Social History  . Marital status: Married    Spouse name: N/A  . Number of children: 7  . Years of education: N/A   Occupational History  . house keeping    Social History Main Topics  . Smoking status: Former Smoker    Years: 4.00  . Smokeless tobacco: Never Used     Comment: aa a teenager  . Alcohol use No  .  Drug use: No  . Sexual activity: Not on file   Other Topics Concern  . Not on file   Social History Narrative  . No narrative on file    Review of Systems:    Constitutional: No weight loss, fever or chills Skin: No rash  Cardiovascular: No chest pain Respiratory: No SOB  Gastrointestinal: See HPI and otherwise negative Genitourinary: No dysuria  Neurological: No headache Musculoskeletal: No new muscle or joint pain Hematologic: No bleeding or bruising Psychiatric: No history of depression  or anxiety    Physical Exam:  Vital signs: BP 102/72 (BP Location: Left Arm, Patient Position: Sitting, Cuff Size: Normal)   Pulse 64   Ht 4\' 10"  (1.473 m) Comment: height measured without shoes  Wt 133 lb 4 oz (60.4 kg)   BMI 27.85 kg/m    Constitutional:   Pleasant [place] female appears to be in NAD, Well developed, Well nourished, alert and cooperative Head:  Normocephalic and atraumatic. Eyes:   PEERL, EOMI. No icterus. Conjunctiva pink. Ears:  Normal auditory acuity. Neck:  Supple Throat: Oral cavity and pharynx without inflammation, swelling or lesion.  Respiratory: Respirations even and unlabored. Lungs clear to auscultation bilaterally.   No wheezes, crackles, or rhonchi.  Cardiovascular: Normal S1, S2. No MRG. Regular rate and rhythm. No peripheral edema, cyanosis or pallor.  Gastrointestinal:  Soft, nondistended, mild generalized ttp, worse in the epigastrum and LLQ. No rebound or guarding. Normal bowel sounds. No appreciable masses or hepatomegaly. Rectal:  Not performed.  Msk:  Symmetrical without gross deformities. Without edema, no deformity or joint abnormality.  Neurologic:  Alert and  oriented x4;  grossly normal neurologically.  Skin:   Dry and intact without significant lesions or rashes. Psychiatric:  Demonstrates good judgement and reason without abnormal affect or behaviors.  MOST RECENT LABS AND IMAGING: CBC    Component Value Date/Time   WBC 6.7 10/19/2016 1800   RBC 5.11 10/19/2016 1800   HGB 14.0 10/19/2016 1800   HCT 42.3 10/19/2016 1800   PLT 256 10/19/2016 1800   MCV 82.8 10/19/2016 1800   MCV 81.8 10/17/2016 0924   MCH 27.4 10/19/2016 1800   MCHC 33.1 10/19/2016 1800   RDW 12.2 10/19/2016 1800   LYMPHSABS 0.9 01/06/2014 1031   MONOABS 0.3 01/06/2014 1031   EOSABS 0.1 01/06/2014 1031   BASOSABS 0.0 01/06/2014 1031    CMP     Component Value Date/Time   NA 139 10/19/2016 1800   NA 142 10/17/2016 0903   K 4.1 10/19/2016 1800   CL 105  10/19/2016 1800   CO2 25 10/19/2016 1800   GLUCOSE 103 (H) 10/19/2016 1800   BUN 14 10/19/2016 1800   BUN 13 10/17/2016 0903   CREATININE 0.91 10/19/2016 1800   CALCIUM 9.4 10/19/2016 1800   PROT 7.6 10/19/2016 1800   PROT 7.8 10/17/2016 0903   ALBUMIN 4.3 10/19/2016 1800   ALBUMIN 4.7 10/17/2016 0903   AST 32 10/19/2016 1800   ALT 29 10/19/2016 1800   ALKPHOS 116 10/19/2016 1800   BILITOT 0.6 10/19/2016 1800   BILITOT 0.6 10/17/2016 0903   GFRNONAA >60 10/19/2016 1800   GFRAA >60 10/19/2016 1800   Ct Abdomen Pelvis W Contrast 10/19/16  Result Date: 10/19/2016 CLINICAL DATA:  Lower abdominal pain for 2 weeks. Clinical concern for diverticulitis per EXAM: CT ABDOMEN AND PELVIS WITH CONTRAST TECHNIQUE: Multidetector CT imaging of the abdomen and pelvis was performed using the standard protocol following bolus administration of intravenous contrast. CONTRAST:  ISOVUE-300 IOPAMIDOL (ISOVUE-300) INJECTION 61% COMPARISON:  Abdominal CT 08/10/2016 FINDINGS: Lower chest: Cystic changes likely bronchiectasis in the right middle and right lower lobes, unchanged from prior exam. No focal airspace disease. No pleural fluid. Hepatobiliary: The gallstones on prior exam are not as well-defined currently. Abnormal gallbladder distention or pericholecystic inflammation. No focal hepatic lesion. No biliary dilatation. Pancreas: No ductal dilatation or inflammation. Spleen: Normal in size without focal abnormality. Small splenule at the hilum adjacent to the pancreatic tail. Adrenals/Urinary Tract: No adrenal nodule. No hydronephrosis or perinephric edema. Scattered low-density lesions the kidneys, too small to characterize, likely small cysts. These are unchanged from prior exam. Cortical scarring of the mid right kidney again seen. Homogeneous enhancement with symmetric excretion on delayed phase imaging. Urinary bladder is physiologically distended. Possible anterior small midline bladder diverticulum,  unchanged from prior exam. Stomach/Bowel: No diverticulitis. No significant diverticular changes in the colon. There is no bowel inflammation, wall thickening or evidence for obstruction. Normal appendix. Vascular/Lymphatic: Mild aortic atherosclerosis. No aneurysm. No abdominal or pelvic adenopathy. Reproductive: Uterus and bilateral adnexa are unremarkable. Probable tubal ligation clips. Other: No free air, free fluid, or intra-abdominal fluid collection. Tiny fat containing umbilical hernia. Musculoskeletal: There are no acute or suspicious osseous abnormalities. IMPRESSION: 1. No acute abnormality or explanation for abdominal pain. Particularly, no diverticulitis or significant diverticular changes. 2. Chronic changes include mild right renal scarring, aortic atherosclerosis, and probable cystic bronchiectasis in the right middle and lower lobes. Electronically Signed   By: Rubye Oaks M.D.   On: 10/19/2016 22:42   Assessment: 1. Abdominal pain: Patient describes this as diffuse on exam this is mostly in the epigastrium and left lower quadrant,described as "burning"; consider relation to gastritis versus H. pylori versus IBS versus other 2. Heartburn: see above 3. Nausea: Likely related to above 3. Diarrhea: Off and on over the past 2 weeks, most recently last night; consider relation to gastritis versus H. pylori versus other 5. Cholelithiasis: Seen on recent CT  Plan: 1. Patient does ask questions regarding her gallstones today. I do not believe at this time this is causing patient's pain or symptoms, though could be contributing. Would recommend she remain on a low-fat diet for now. 2. Prescribed Omeprazole 40 mg twice daily, 30-60 minutes before eating breakfast and dinner. #60 with 2 refills 3. Reviewed antireflux diet and lifestyle modifications. Provided the patient with a handout. 4. If the above medication does not help could consider EGD and/or right upper quadrant ultrasound for  further evaluation gallbladder 5. Ordered H. pylori fecal antigen 6. Recommend patient hold MiraLAX while having loose stools. 7. Patient to return to clinic in 2-3 weeks with me. She was assigned to Dr. Marina Goodell today as he is supervising.  Hyacinth Meeker, PA-C [facility] 11/06/2016, 9:10 AM  Cc: Georgina Quint, *

## 2016-11-06 NOTE — Patient Instructions (Signed)
Your physician has requested that you go to the basement for lab work before leaving today.  We have sent the following medications to your pharmacy for you to pick up at your convenience: Omeprazole 40 mg twice a day  Continue Dicyclomine 20 mg before meals and at bedtime.

## 2016-11-06 NOTE — Progress Notes (Signed)
Initial assessment and plans reviewed 

## 2016-11-10 LAB — HELICOBACTER PYLORI  SPECIAL ANTIGEN: H. PYLORI Antigen: DETECTED

## 2016-11-18 ENCOUNTER — Other Ambulatory Visit: Payer: Self-pay

## 2016-11-18 MED ORDER — AMOXICILLIN 500 MG PO TABS: 1000.0000 mg | ORAL_TABLET | Freq: Two times a day (BID) | ORAL | 0 refills | Status: AC

## 2016-11-18 MED ORDER — CLARITHROMYCIN 500 MG PO TABS: 500.0000 mg | ORAL_TABLET | Freq: Two times a day (BID) | ORAL | 0 refills | Status: AC

## 2016-11-23 ENCOUNTER — Ambulatory Visit (INDEPENDENT_AMBULATORY_CARE_PROVIDER_SITE_OTHER): Payer: BLUE CROSS/BLUE SHIELD | Admitting: Physician Assistant

## 2016-11-23 ENCOUNTER — Encounter: Payer: Self-pay | Admitting: Physician Assistant

## 2016-11-23 VITALS — BP 108/70 | HR 72 | Ht <= 58 in | Wt 135.1 lb

## 2016-11-23 DIAGNOSIS — R1013 Epigastric pain: Secondary | ICD-10-CM

## 2016-11-23 DIAGNOSIS — R197 Diarrhea, unspecified: Secondary | ICD-10-CM | POA: Diagnosis not present

## 2016-11-23 DIAGNOSIS — A048 Other specified bacterial intestinal infections: Secondary | ICD-10-CM | POA: Diagnosis not present

## 2016-11-23 DIAGNOSIS — R110 Nausea: Secondary | ICD-10-CM | POA: Diagnosis not present

## 2016-11-23 NOTE — Patient Instructions (Signed)
Your physician has requested that you go to the basement for the following lab work on 12/21/16.

## 2016-11-23 NOTE — Progress Notes (Signed)
Initial assessment and plans by GI physician assistant reviewed

## 2016-11-23 NOTE — Progress Notes (Signed)
Chief Complaint: Abdominal pain, diarrhea and nausea  HPI:  Theresa Weaver is a 51 year old [place] female presents clinic today accompanied by her daughter does translate for her, she was originally referred to me by Peyton Najjar, MD for a complaint of abdominal pain, diarrhea and nausea and is here today for follow-up.   At time of last visit 11/06/16 patient had just been seen in the ER for this abdominal pain with normal labs and imaging. At time of visit patient's history was somewhat poor but she described abdominal pain 5-10 minutes after eating which radiated around to her back and was described as "burning". This was accompanied by nausea and vomiting as well as some diarrhea. At that visit patient had an H. pylori fecal antigen ordered and was started on omeprazole 40 mg twice a day. H. pylori testing returned positive and she was started on triple therapy just recently.   Today, the patient returns to clinic accompanied by her daughter and has been taking her antibiotics for H. pylori for 3-4 days. She already feels relieved from all of her abdominal discomfort and has not had to use her dicyclomine for any breakthrough pain. She denies any heartburn, reflux or continued diarrhea. Overall she is much improved. She has questions regarding what she does going forward.   Patient denies fever, chills, blood in her stool, melena, weight loss, fatigue and anorexia, nausea or vomiting.  Past medical and Past Surgical History:  Procedure Laterality Date  . ABDOMINAL HYSTERECTOMY    . TUBAL LIGATION    . WRIST SURGERY Left     Current Outpatient Prescriptions  Medication Sig Dispense Refill  . amoxicillin (AMOXIL) 500 MG tablet Take 2 tablets (1,000 mg total) by mouth 2 (two) times daily. 56 tablet 0  . clarithromycin (BIAXIN) 500 MG tablet Take 1 tablet (500 mg total) by mouth 2 (two) times daily. 28 tablet 0  . dicyclomine (BENTYL) 20 MG tablet Take 1 tablet (20 mg total) by mouth 4  (four) times daily -  before meals and at bedtime. 100 tablet 0  . omeprazole (PRILOSEC) 40 MG capsule Take 1 capsule (40 mg total) by mouth 2 (two) times daily. 60 capsule 1  . polyethylene glycol powder (GLYCOLAX/MIRALAX) powder Take 17 g by mouth as needed.     No current facility-administered medications for this visit.     Allergies as of 11/23/2016  . (No Known Allergies)    Family History  Problem Relation Age of Onset  . Family history unknown: Yes    Social History   Social History  . Marital status: Married    Spouse name: N/A  . Number of children: 7  . Years of education: N/A   Occupational History  . house keeping    Social History Main Topics  . Smoking status: Former Smoker    Years: 4.00  . Smokeless tobacco: Never Used     Comment: aa a teenager  . Alcohol use No  . Drug use: No  . Sexual activity: Not on file   Other Topics Concern  . Not on file   Social History Narrative  . No narrative on file    Review of Systems:    Constitutional: No weight loss, fever or chills Cardiovascular: No chest pain Respiratory: No SOB  Gastrointestinal: See HPI and otherwise negative   Physical Exam:  Vital signs: BP 108/70 (BP Location: Left Arm, Patient Position: Sitting, Cuff Size: Normal)   Pulse 72   Ht   (1.473 m)   Wt 135 lb 2 oz (61.3 kg)   BMI 28.24 kg/m    Constitutional:   Pleasant [place] female appears to be in NAD, Well developed, Well nourished, alert and cooperative Respiratory: Respirations even and unlabored. Lungs clear to auscultation bilaterally.   No wheezes, crackles, or rhonchi.  Cardiovascular: Normal S1, S2. No MRG. Regular rate and rhythm. No peripheral edema, cyanosis or pallor.  Gastrointestinal:  Soft, nondistended, nontender. No rebound or guarding. Normal bowel sounds. No appreciable masses or hepatomegaly. Psychiatric: Demonstrates good judgement and reason without abnormal affect or behaviors.  MOST RECENT LABS  AND IMAGING: CBC    Component Value Date/Time   WBC 6.7 10/19/2016 1800   RBC 5.11 10/19/2016 1800   HGB 14.0 10/19/2016 1800   HCT 42.3 10/19/2016 1800   PLT 256 10/19/2016 1800   MCV 82.8 10/19/2016 1800   MCV 81.8 10/17/2016 0924   MCH 27.4 10/19/2016 1800   MCHC 33.1 10/19/2016 1800   RDW 12.2 10/19/2016 1800   LYMPHSABS 0.9 01/06/2014 1031   MONOABS 0.3 01/06/2014 1031   EOSABS 0.1 01/06/2014 1031   BASOSABS 0.0 01/06/2014 1031    CMP     Component Value Date/Time   NA 139 10/19/2016 1800   NA 142 10/17/2016 0903   K 4.1 10/19/2016 1800   CL 105 10/19/2016 1800   CO2 25 10/19/2016 1800   GLUCOSE 103 (H) 10/19/2016 1800   BUN 14 10/19/2016 1800   BUN 13 10/17/2016 0903   CREATININE 0.91 10/19/2016 1800   CALCIUM 9.4 10/19/2016 1800   PROT 7.6 10/19/2016 1800   PROT 7.8 10/17/2016 0903   ALBUMIN 4.3 10/19/2016 1800   ALBUMIN 4.7 10/17/2016 0903   AST 32 10/19/2016 1800   ALT 29 10/19/2016 1800   ALKPHOS 116 10/19/2016 1800   BILITOT 0.6 10/19/2016 1800   BILITOT 0.6 10/17/2016 0903   GFRNONAA >60 10/19/2016 1800   GFRAA >60 10/19/2016 1800    Assessment: 1. Abdominal pain: H. pylori fecal antigen positive at time of last visit, patient is currently on triple therapy and is no longer experiencing symptoms 2. Nausea and vomiting: Improved as above 3. Diarrhea: Improved as above  Plan: 1. Patient is to complete her triple therapy as directed. We did go over directions for this again today. 2. Repeat fecal antigen was ordered in 6 weeks. We did discuss that the patient will need to be off of her PPI medication for 4 weeks prior to this testing. 3. Patient to follow up as needed with me in the future.  Hyacinth Meeker, PA-C [facility] 11/23/2016, 8:53 AM  Cc: Peyton Najjar, MD

## 2017-12-05 ENCOUNTER — Emergency Department (HOSPITAL_COMMUNITY)
Admission: EM | Admit: 2017-12-05 | Discharge: 2017-12-05 | Disposition: A | Discharge status: Home / Self Care | Payer: BLUE CROSS/BLUE SHIELD | Attending: Emergency Medicine | Admitting: Emergency Medicine

## 2017-12-05 ENCOUNTER — Emergency Department (HOSPITAL_COMMUNITY): Payer: BLUE CROSS/BLUE SHIELD

## 2017-12-05 DIAGNOSIS — Z87891 Personal history of nicotine dependence: Secondary | ICD-10-CM | POA: Diagnosis not present

## 2017-12-05 DIAGNOSIS — R0789 Other chest pain: Secondary | ICD-10-CM | POA: Diagnosis not present

## 2017-12-05 DIAGNOSIS — Z79899 Other long term (current) drug therapy: Secondary | ICD-10-CM | POA: Diagnosis not present

## 2017-12-05 MED ORDER — IBUPROFEN 800 MG PO TABS
800.0000 mg | ORAL_TABLET | Freq: Once | ORAL | Status: AC
  Administered 2017-12-05: 800 mg via ORAL
  Filled 2017-12-05: qty 1

## 2017-12-05 MED ORDER — IBUPROFEN 800 MG PO TABS: 800.0000 mg | ORAL_TABLET | Freq: Three times a day (TID) | ORAL | 0 refills | Status: DC

## 2017-12-05 NOTE — ED Notes (Signed)
Bed: WTR5 Expected date:  Expected time:  Means of arrival:  Comments: 
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
## 2018-01-17 ENCOUNTER — Encounter: Payer: Self-pay | Admitting: Physician Assistant

## 2018-01-17 ENCOUNTER — Ambulatory Visit: Payer: BLUE CROSS/BLUE SHIELD | Admitting: Physician Assistant

## 2018-01-17 ENCOUNTER — Other Ambulatory Visit: Payer: Self-pay

## 2018-01-17 VITALS — BP 118/67 | HR 64 | Temp 97.7°F | Resp 18 | Ht 58.07 in | Wt 134.2 lb

## 2018-01-17 DIAGNOSIS — M542 Cervicalgia: Secondary | ICD-10-CM

## 2018-01-17 DIAGNOSIS — Z789 Other specified health status: Secondary | ICD-10-CM | POA: Diagnosis not present

## 2018-01-17 DIAGNOSIS — R1084 Generalized abdominal pain: Secondary | ICD-10-CM

## 2018-01-17 DIAGNOSIS — M79601 Pain in right arm: Secondary | ICD-10-CM

## 2018-01-17 LAB — POCT CBC
Granulocyte percent: 60 %G (ref 37–80)
HCT, POC: 43.5 % — AB (ref 29–41)
Hemoglobin: 14.7 g/dL — AB (ref 9.5–13.5)
Lymph, poc: 1.8 (ref 0.6–3.4)
MCH, POC: 28.4 pg (ref 27–31.2)
MCHC: 33.9 g/dL (ref 31.8–35.4)
MCV: 83.9 fL (ref 76–111)
MID (cbc): 0.2 (ref 0–0.9)
MPV: 6.9 fL (ref 0–99.8)
POC Granulocyte: 3 (ref 2–6.9)
POC LYMPH PERCENT: 35.4 %L (ref 10–50)
POC MID %: 4.6 %M (ref 0–12)
Platelet Count, POC: 267 10*3/uL (ref 142–424)
RBC: 5.19 M/uL (ref 4.04–5.48)
RDW, POC: 12.6 %
WBC: 5 10*3/uL (ref 4.6–10.2)

## 2018-01-17 LAB — POCT URINALYSIS DIP (MANUAL ENTRY)
Bilirubin, UA: NEGATIVE
Blood, UA: NEGATIVE
Glucose, UA: NEGATIVE mg/dL
Ketones, POC UA: NEGATIVE mg/dL
Leukocytes, UA: NEGATIVE
Nitrite, UA: NEGATIVE
Protein Ur, POC: NEGATIVE mg/dL
Spec Grav, UA: 1.015 (ref 1.010–1.025)
Urobilinogen, UA: 0.2 E.U./dL
pH, UA: 5 (ref 5.0–8.0)

## 2018-01-17 LAB — POC MICROSCOPIC URINALYSIS (UMFC): Mucus: ABSENT

## 2018-01-17 LAB — POCT URINE PREGNANCY: Preg Test, Ur: NEGATIVE

## 2018-01-17 MED ORDER — OMEPRAZOLE 40 MG PO CPDR: 40.0000 mg | DELAYED_RELEASE_CAPSULE | Freq: Every day | ORAL | 0 refills | Status: DC

## 2018-01-17 NOTE — Patient Instructions (Addendum)
For abdominal pain, I am concerned about peptic ulcer disease. Please stop using ibuprofen as this can cause ulcers in the stomach. Start omeprazole 40mg  daily. If no improvement after 4 weeks, please return. If any symptoms worsen or you develop new concerning symptoms, please seek care immediately.   For arm pain, this is consistent with cervical radiculopathy. Trying icing neck a few times per day or heat if this feels better for you.  I recommend returning tomorrow for a neck image. You can come to the front desk and tell them you are here for imaging only.Considering you are having upset stomach, I do not recommend ibuprofen or oral steroids at this time. I suggest following up with an orthopedic surgeon for further evaluation/management. In the meantime, try to avoid repetitive movements with the right arm.     We recommend that you schedule a mammogram for breast cancer screening. Typically, you do not need a referral to do this. Please contact a local imaging center to schedule your mammogram.  [facility] - [phone]  *ask for the Radiology Department [facility]) - [phone] or [phone]  [facility] - [phone] [facility] - [phone] [facility] - [phone]  *ask for the Radiology Department [facility] - [phone]  *ask for the Radiology Department [facility] - [phone]  *ask for the Mammography Department [facility] - [phone]    If you have lab work done today you will be contacted with your lab results within the next 2 weeks.  If you have not heard from [place] then please contact us. The fastest way to get your results is to register for My Chart.  Peptic Ulcer A peptic ulcer is a painful sore in the lining of your esophagus, stomach, or the first part of your small intestine. You may have pain in the area between your chest and your belly  button. The most common causes of an ulcer are:  An infection.  Using certain pain medicines too often or too much.  Follow these instructions at home:  Avoid alcohol.  Avoid caffeine.  Do not use any tobacco products. These include cigarettes, chewing tobacco, and e-cigarettes. If you need help quitting, ask your doctor.  Take over-the-counter and prescription medicines only as told by your doctor. Do not stop or change your medicines unless you talk with your doctor about it first.  Keep all follow-up visits as told by your doctor. This is important. Contact a doctor if:  You do not get better in 7 days after you start treatment.  You keep having an upset stomach (indigestion) or heartburn. Get help right away if:  You have sudden, sharp pain in your belly (abdomen).  You have lasting belly pain.  You have bloody poop (stool) or black, tarry poop.  You throw up (vomit) blood. It may look like coffee grounds.  You feel light-headed or feel like you may pass out (faint).  You get weak.  You get sweaty or feel sticky and cold to the touch (clammy). This information is not intended to replace advice given to you by your health care provider. Make sure you discuss any questions you have with your health care provider. Document Released: 05/20/2009 Document Revised: 07/10/2015 Document Reviewed: 11/24/2014 Elsevier Interactive Patient Education  2018 [employer].  Cervical Radiculopathy Cervical radiculopathy means that a nerve in the neck is pinched or bruised. This can cause pain  or loss of feeling (numbness) that runs from your neck to your arm and fingers. Follow these instructions at home: Managing pain  Take over-the-counter and prescription medicines only as told by your doctor.  If directed, put ice on the injured or painful area. ? Put ice in a plastic bag. ? Place a towel between your skin and the bag. ? Leave the ice on for 20 minutes, 2-3 times per  day.  If ice does not help, you can try using heat. Take a warm shower or warm bath, or use a heat pack as told by your doctor.  You may try a gentle neck and shoulder massage. Activity  Rest as needed. Follow instructions from your doctor about any activities to avoid.  Do exercises as told by your doctor or physical therapist. General instructions  If you were given a soft collar, wear it as told by your doctor.  Use a flat pillow when you sleep.  Keep all follow-up visits as told by your doctor. This is important. Contact a doctor if:  Your condition does not improve with treatment. Get help right away if:  Your pain gets worse and is not controlled with medicine.  You lose feeling or feel weak in your hand, arm, face, or leg.  You have a fever.  You have a stiff neck.  You cannot control when you poop or pee (have incontinence).  You have trouble with walking, balance, or talking. This information is not intended to replace advice given to you by your health care provider. Make sure you discuss any questions you have with your health care provider. Document Released: 02/12/2011 Document Revised: 08/01/2015 Document Reviewed: 04/19/2014 Elsevier Interactive Patient Education  2018 [employer].   IF you received an x-ray today, you will receive an invoice from [facility]. Please contact [facility] at [phone] with questions or concerns regarding your invoice.   IF you received labwork today, you will receive an invoice from [facility]. Please contact [facility] at [phone] with questions or concerns regarding your invoice.   Our billing staff will not be able to assist you with questions regarding bills from these companies.  You will be contacted with the lab results as soon as they are available. The fastest way to get your results is to activate your My Chart account. Instructions are located on the last page of this paperwork. If you have  not heard from [place] regarding the results in 2 weeks, please contact this office.

## 2018-01-17 NOTE — Progress Notes (Signed)
Theresa Weaver  MRN: [id number] DOB: 1965/07/05  Subjective:   Theresa Weaver is a 52 y.o. female who presents for evaluation of abdominal pain. Onset was 1 week ago. Symptoms have been unchanged. Pain is 7/10 in intensity. Pain is located in the LUQ without radiation.  Aggravating factors: none.  Alleviating factors: none. Associated symptoms: belching and acid reflux sensation. Threw up once last week.  The patient denies anorexia, arthralagias, chills, fever, frequency, headache, hematemesis. hematochezia, hematuria, melena, myalgias, nausea and sweats. Last BM today. Takes ibuprofen every day x months. This does not help. Denies alcohol use. Denies smoking.  PSH of hysterectomy. PMH of constipation, gallstones, and h.pylori. Not currently taking prilosec.    -also with right arm pain x months. Cannot identify any aggravating or relieving factors. Has associated numbness, tingling. There is no history of injury but does use right arm consistently at work. Not dropping anything or having weakness.  Evaluation to date: none. Treatment to date: OTC analgesics, which help some.   Primary language is "bonjee.?" does not have anyone here to interpret. [employer] interpreter could not identify language. She finally got a friend on the phone to translate.  Review of Systems Per HPI  Patient Active Problem List   Diagnosis Date Noted  . Left lower quadrant pain 08/10/2016  . Generalized abdominal pain 08/10/2016  . Right lower quadrant abdominal tenderness without rebound tenderness 08/10/2016    Current Outpatient Medications on File Prior to Visit  Medication Sig Dispense Refill  . ibuprofen (ADVIL,MOTRIN) 800 MG tablet Take 1 tablet (800 mg total) by mouth 3 (three) times daily. 21 tablet 0  . dicyclomine (BENTYL) 20 MG tablet Take 1 tablet (20 mg total) by mouth 4 (four) times daily -  before meals and at bedtime. (Patient not taking: Reported on 01/17/2018) 100 tablet 0  . polyethylene glycol powder  (GLYCOLAX/MIRALAX) powder Take 17 g by mouth as needed.     No current facility-administered medications on file prior to visit.     No Known Allergies    Social History   Socioeconomic History  . Marital status: Married    Spouse name: Not on file  . Number of children: 7  . Years of education: Not on file  . Highest education level: Not on file  Occupational History  . Occupation: house keeping  Social Needs  . Financial resource strain: Not on file  . Food insecurity:    Worry: Not on file    Inability: Not on file  . Transportation needs:    Medical: Not on file    Non-medical: Not on file  Tobacco Use  . Smoking status: Former Smoker    Years: 4.00  . Smokeless tobacco: Never Used  . Tobacco comment: aa a teenager  Substance and Sexual Activity  . Alcohol use: No    Alcohol/week: 0.0 standard drinks  . Drug use: No  . Sexual activity: Yes  Lifestyle  . Physical activity:    Days per week: Not on file    Minutes per session: Not on file  . Stress: Not on file  Relationships  . Social connections:    Talks on phone: Not on file    Gets together: Not on file    Attends religious service: Not on file    Active member of club or organization: Not on file    Attends meetings of clubs or organizations: Not on file    Relationship status: Not on file  . Intimate partner  violence:    Fear of current or ex partner: Not on file    Emotionally abused: Not on file    Physically abused: Not on file    Forced sexual activity: Not on file  Other Topics Concern  . Not on file  Social History Narrative  . Not on file   Objective:  BP 118/67   Pulse 64   Temp 97.7 F (36.5 C) (Oral)   Resp 18   Ht 4' 10.07" (1.475 m)   Wt 134 lb 3.2 oz (60.9 kg)   SpO2 100%   BMI 27.98 kg/m   Physical Exam  Constitutional: She is oriented to person, place, and time. She appears well-developed and well-nourished. She does not appear ill. No distress.  HENT:  Head:  Normocephalic and atraumatic.  Eyes: Conjunctivae are normal.  Neck: Normal range of motion. Muscular tenderness (with palpation of right sided trapezius musculature ) present. No spinous process tenderness present. No neck rigidity. No edema, no erythema and normal range of motion present. No Brudzinski's sign and no Kernig's sign noted.  Pulmonary/Chest: Effort normal.  Abdominal: Soft. Normal appearance and bowel sounds are normal. There is generalized tenderness (mild diffuse TTP, no focal area of pain noted). There is no rigidity, no guarding, no tenderness at McBurney's point and negative Murphy's sign. No hernia.  Musculoskeletal:       Right shoulder: Normal.       Right elbow: Normal.      Right wrist: Normal.       Right hand: Normal. She exhibits normal range of motion, normal capillary refill and no swelling. Normal sensation noted. Normal strength noted.  Neurological: She is alert and oriented to person, place, and time.  Strength of BUE 5/5. Sensation of BUE intact.  Negative Phalen's maneuver. +Spurling's maneuver to right.    Skin: Skin is warm and dry.  Psychiatric: She has a normal mood and affect.  Vitals reviewed.  Results for orders placed or performed in visit on 01/17/18 (from the past 24 hour(s))  POCT urinalysis dipstick     Status: None   Collection Time: 01/17/18  9:10 AM  Result Value Ref Range   Color, UA yellow yellow   Clarity, UA clear clear   Glucose, UA negative negative mg/dL   Bilirubin, UA negative negative   Ketones, POC UA negative negative mg/dL   Spec Grav, UA 1.015 1.010 - 1.025   Blood, UA negative negative   pH, UA 5.0 5.0 - 8.0   Protein Ur, POC negative negative mg/dL   Urobilinogen, UA 0.2 0.2 or 1.0 E.U./dL   Nitrite, UA Negative Negative   Leukocytes, UA Negative Negative  POCT urine pregnancy     Status: None   Collection Time: 01/17/18  9:11 AM  Result Value Ref Range   Preg Test, Ur Negative Negative  POCT Microscopic  Urinalysis ([facility])     Status: None   Collection Time: 01/17/18  9:20 AM  Result Value Ref Range   WBC,UR,HPF,POC None None WBC/hpf   RBC,UR,HPF,POC None None RBC/hpf   Bacteria None None, Too numerous to count   Mucus Absent Absent   Epithelial Cells, UR Per Microscopy None None, Too numerous to count cells/hpf  POCT CBC     Status: Abnormal   Collection Time: 01/17/18  9:21 AM  Result Value Ref Range   WBC 5.0 4.6 - 10.2 K/uL   Lymph, poc 1.8 0.6 - 3.4   POC LYMPH PERCENT 35.4 10 - 50 %  L   MID (cbc) 0.2 0 - 0.9   POC MID % 4.6 0 - 12 %M   POC Granulocyte 3.0 2 - 6.9   Granulocyte percent 60.0 37 - 80 %G   RBC 5.19 4.04 - 5.48 M/uL   Hemoglobin 14.7 (A) 9.5 - 13.5 g/dL   HCT, POC 43.5 (A) 29 - 41 %   MCV 83.9 76 - 111 fL   MCH, POC 28.4 27 - 31.2 pg   MCHC 33.9 31.8 - 35.4 g/dL   RDW, POC 12.6 %   Platelet Count, POC 267 142 - 424 K/uL   MPV 6.9 0 - 99.8 fL    Assessment and Plan :  1. Generalized abdominal pain Pt is overall well appearing, NAD. Vitals stable. WBC wnl. UA and micro nml. There is a clear language barrier. Pt keeps pointing to mid epigastric and LUQ when asked about area of pain, but on exam there is no focal tenderness in these appears but instead just a generalized TTP, no rigidity or guarding. Concern for underlying PUD due to daily/frequent use of NSAIDs. Rec d/c NSAIDs at this time. Start omeprazole. Labs pending. Given strict return/ED precautions.  - POCT urinalysis dipstick - POCT Microscopic Urinalysis ([facility]) - POCT urine pregnancy - POCT CBC - CMP14+EGFR - Lipase - H. pylori breath test - omeprazole (PRILOSEC) 40 MG capsule; Take 1 capsule (40 mg total) by mouth daily.  Dispense: 30 capsule; Refill: 0  2. Neck pain Hx and PE findings concerning for cervical radiculopathy. Normal neuro exam today. Can return tomorrow for cervical neck imaging as xray lab tech not here today. Will avoid use of NSAID and prednisone at this time due to concern for PUD.  Referral to orthopedics placed.  - Ambulatory referral to Orthopedic Surgery  3. Pain of right upper extremity - Ambulatory referral to Orthopedic Surgery - Care order/instruction:  4. Language barrier Clear language barrier noted.   Side effects, risks, benefits, and alternatives of the medications and treatment plan prescribed today were discussed, and patient expressed understanding of the instructions given. No barriers to understanding were identified. Red flags discussed in detail. Pt expressed understanding regarding what to do in case of emergency/urgent symptoms.   Tenna Delaine PA-C  [facility] 01/17/2018 9:28 AM

## 2018-01-18 ENCOUNTER — Other Ambulatory Visit: Payer: Self-pay | Admitting: Physician Assistant

## 2018-01-18 ENCOUNTER — Ambulatory Visit: Payer: BLUE CROSS/BLUE SHIELD

## 2018-01-18 ENCOUNTER — Ambulatory Visit (INDEPENDENT_AMBULATORY_CARE_PROVIDER_SITE_OTHER): Payer: BLUE CROSS/BLUE SHIELD

## 2018-01-18 DIAGNOSIS — M542 Cervicalgia: Secondary | ICD-10-CM | POA: Diagnosis not present

## 2018-01-18 LAB — CMP14+EGFR
ALT: 24 IU/L (ref 0–32)
AST: 28 IU/L (ref 0–40)
Albumin/Globulin Ratio: 1.6 (ref 1.2–2.2)
Albumin: 4.6 g/dL (ref 3.5–5.5)
Alkaline Phosphatase: 139 IU/L — ABNORMAL HIGH (ref 39–117)
BUN/Creatinine Ratio: 19 (ref 9–23)
BUN: 16 mg/dL (ref 6–24)
Bilirubin Total: 0.5 mg/dL (ref 0.0–1.2)
CO2: 23 mmol/L (ref 20–29)
Calcium: 9.7 mg/dL (ref 8.7–10.2)
Chloride: 102 mmol/L (ref 96–106)
Creatinine, Ser: 0.85 mg/dL (ref 0.57–1.00)
GFR calc Af Amer: 90 mL/min/{1.73_m2} (ref >59)
GFR calc non Af Amer: 78 mL/min/{1.73_m2} (ref >59)
Globulin, Total: 2.8 g/dL (ref 1.5–4.5)
Glucose: 85 mg/dL (ref 65–99)
Potassium: 4.3 mmol/L (ref 3.5–5.2)
Sodium: 142 mmol/L (ref 134–144)
Total Protein: 7.4 g/dL (ref 6.0–8.5)

## 2018-01-18 LAB — LIPASE: Lipase: 22 U/L (ref 14–72)

## 2018-01-18 NOTE — Progress Notes (Signed)
Orders Placed This Encounter  Procedures  . DG Cervical Spine Complete    Standing Status:   Future    Standing Expiration Date:   03/21/2019    Order Specific Question:   Reason for Exam (SYMPTOM  OR DIAGNOSIS REQUIRED)    Answer:   concern for cerical radiculopathy.    Order Specific Question:   Is patient pregnant?    Answer:   No    Order Specific Question:   Preferred imaging location?    Answer:   External    Order Specific Question:   Radiology Contrast Protocol - do NOT remove file path    Answer:   [email or web address]

## 2018-01-19 LAB — H. PYLORI BREATH TEST: H pylori Breath Test: NEGATIVE

## 2018-01-19 LAB — H. PYLORI BREATH COLLECTION

## 2018-03-23 ENCOUNTER — Other Ambulatory Visit: Payer: Self-pay

## 2018-03-23 ENCOUNTER — Ambulatory Visit (HOSPITAL_COMMUNITY)
Admission: EM | Admit: 2018-03-23 | Discharge: 2018-03-23 | Disposition: A | Discharge status: Home / Self Care | Payer: BLUE CROSS/BLUE SHIELD | Attending: Family Medicine | Admitting: Family Medicine

## 2018-03-23 ENCOUNTER — Encounter (HOSPITAL_COMMUNITY): Payer: Self-pay | Admitting: Emergency Medicine

## 2018-03-23 DIAGNOSIS — J209 Acute bronchitis, unspecified: Secondary | ICD-10-CM

## 2018-03-23 DIAGNOSIS — R0781 Pleurodynia: Secondary | ICD-10-CM

## 2018-03-23 DIAGNOSIS — R05 Cough: Secondary | ICD-10-CM

## 2018-03-23 DIAGNOSIS — S46912A Strain of unspecified muscle, fascia and tendon at shoulder and upper arm level, left arm, initial encounter: Secondary | ICD-10-CM

## 2018-03-23 MED ORDER — PREDNISONE 50 MG PO TABS: ORAL_TABLET | ORAL | 0 refills | Status: DC
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
## 2018-03-23 NOTE — Discharge Instructions (Addendum)
If cough persists after 5 days, please return here.  Please make an appointment with the physical therapist noted below.

## 2018-12-23 IMAGING — DX DG HIP (WITH OR WITHOUT PELVIS) 2-3V*L*
3 series · 3 of 3 positions shown · non-contrast
Comparison: None.

CLINICAL DATA: Left lower quadrant pain and left hip pain.

EXAM:
DG HIP (WITH OR WITHOUT PELVIS) 2-3V LEFT

[pelvis ap]
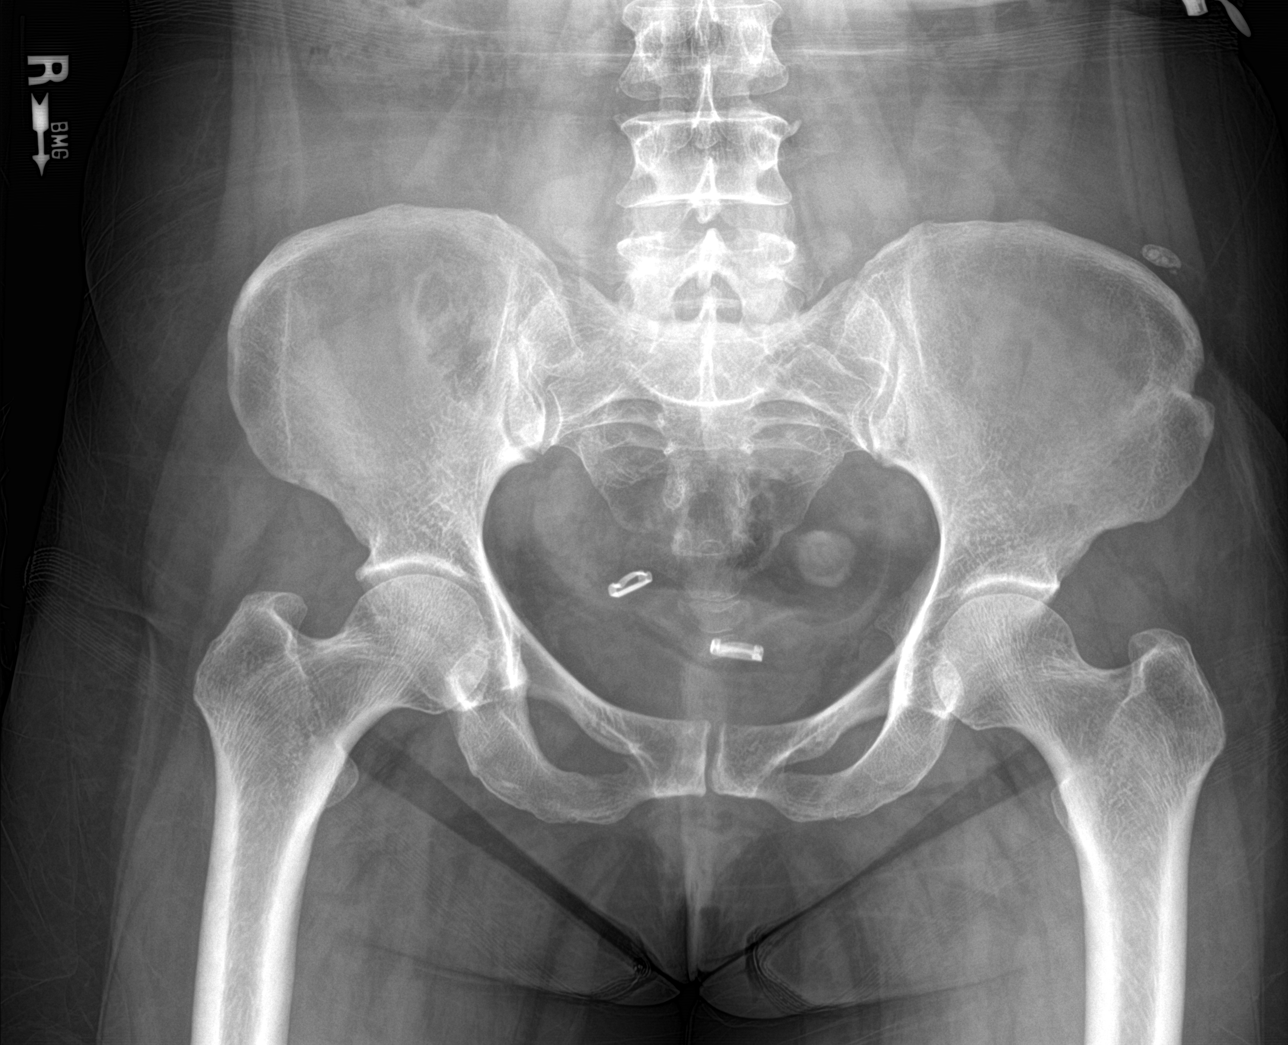

[hip ap]
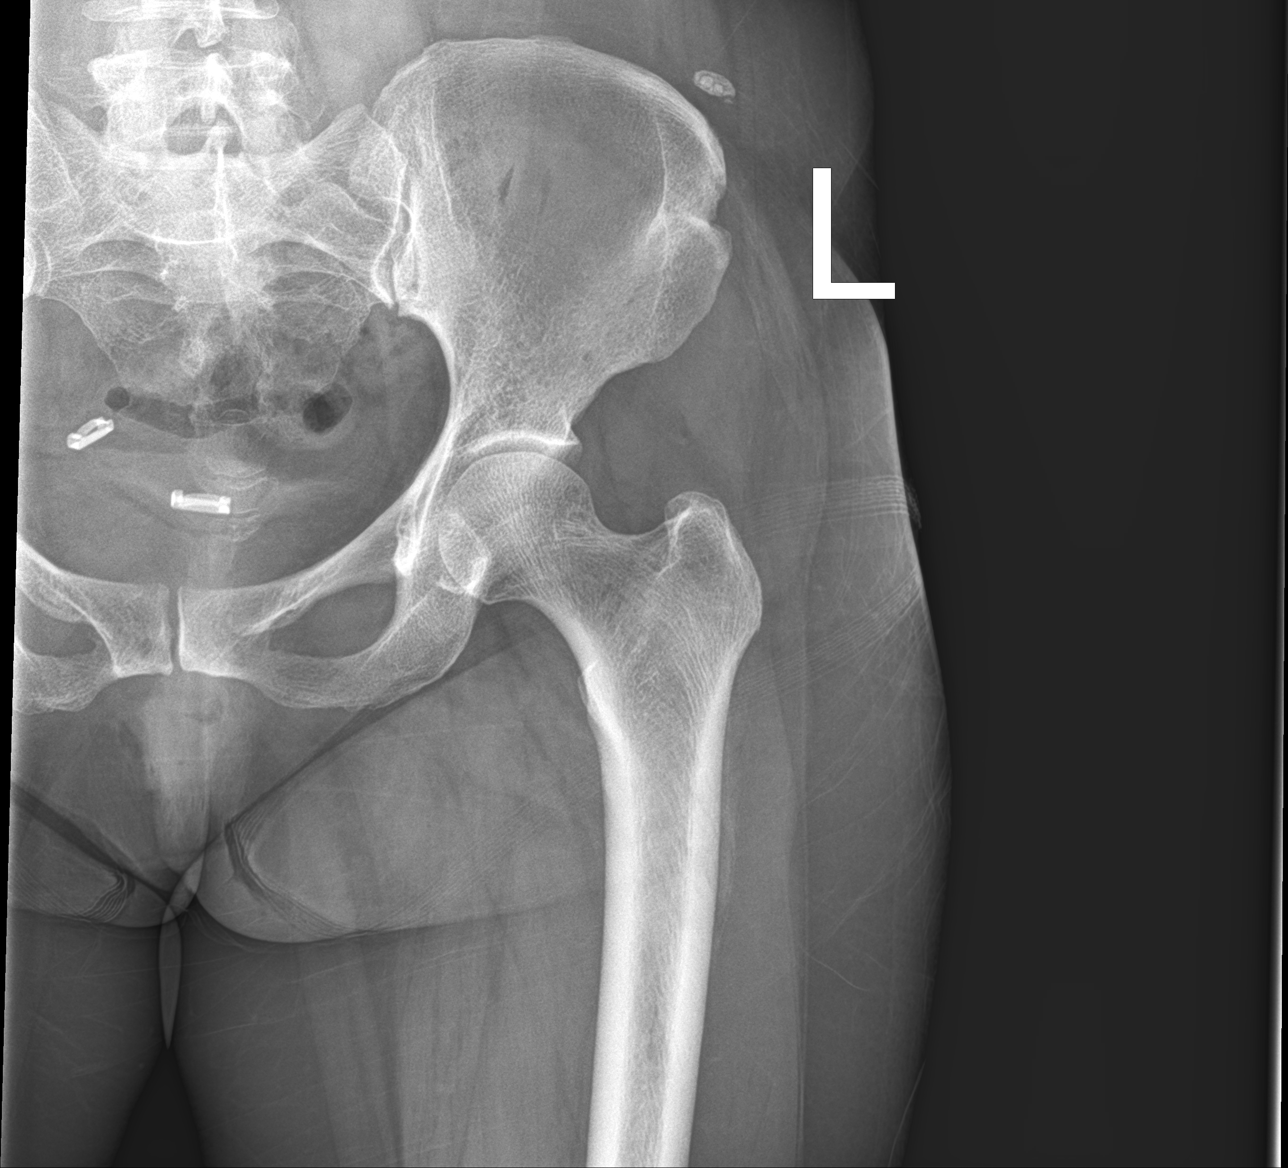

[hip lat]
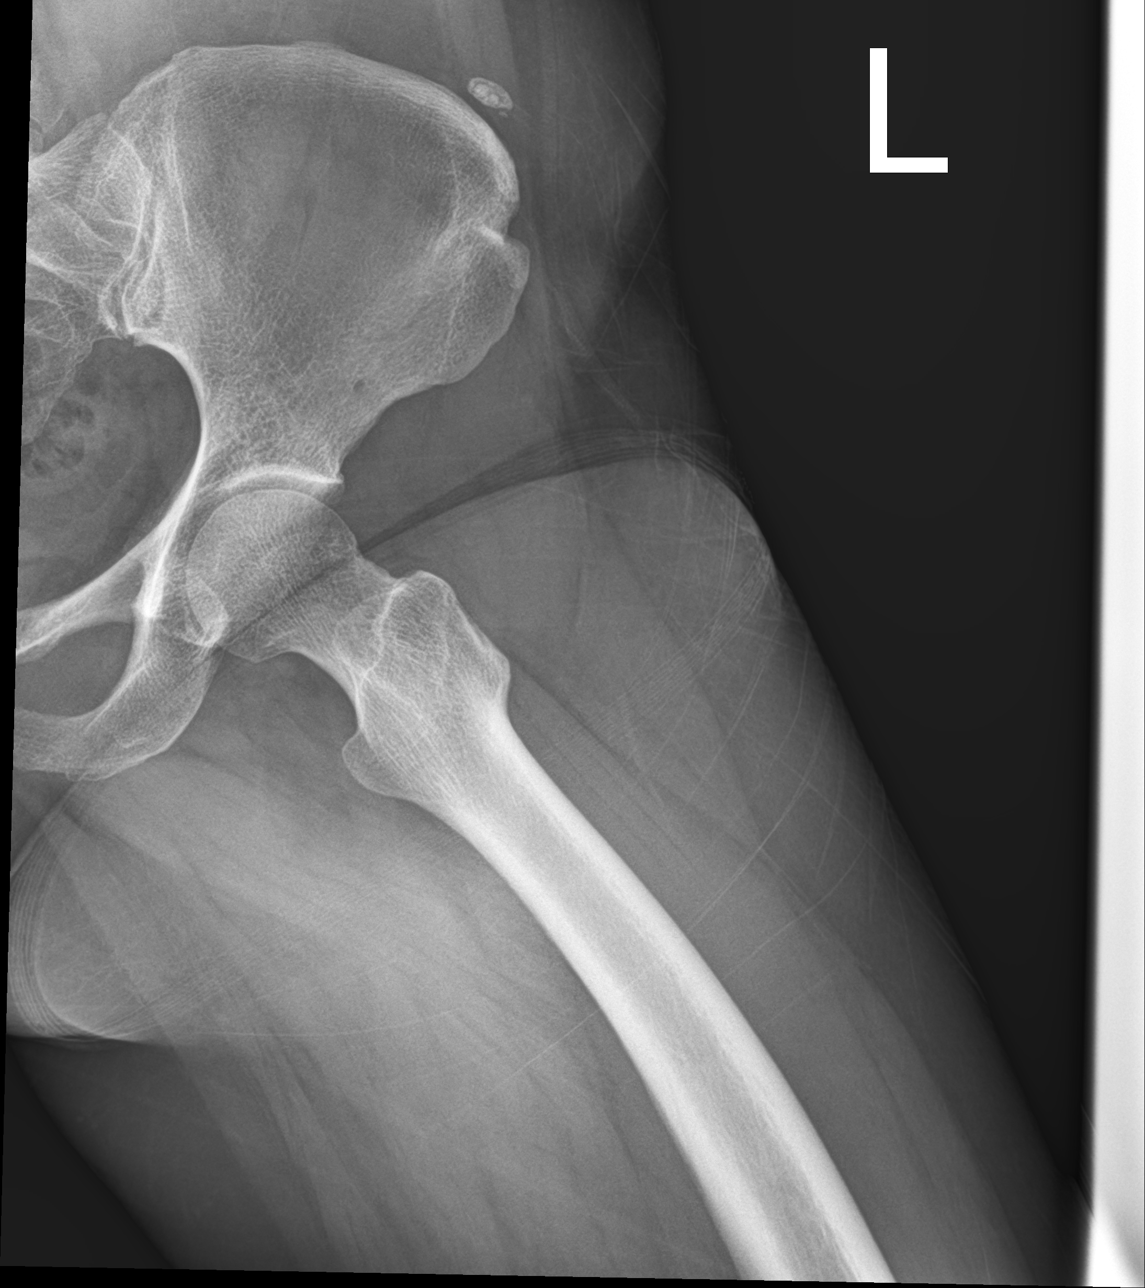

[3 of 3 positions shown; findings below may reference images not displayed]

FINDINGS: Bones, joint spaces and soft tissues over the left hip are within
normal. No evidence of fracture or dislocation. Metallic clips over
the pelvis likely due to prior tubal ligation. Minimal degenerate
change of the spine.
IMPRESSION: No acute findings.

## 2019-04-17 ENCOUNTER — Other Ambulatory Visit: Payer: Self-pay

## 2019-04-17 ENCOUNTER — Ambulatory Visit (INDEPENDENT_AMBULATORY_CARE_PROVIDER_SITE_OTHER): Payer: Self-pay | Admitting: Registered Nurse

## 2019-04-17 ENCOUNTER — Encounter: Payer: Self-pay | Admitting: Registered Nurse

## 2019-04-17 VITALS — BP 129/84 | HR 80 | Temp 98.0°F | Ht <= 58 in | Wt 141.0 lb

## 2019-04-17 DIAGNOSIS — Z8639 Personal history of other endocrine, nutritional and metabolic disease: Secondary | ICD-10-CM

## 2019-04-17 DIAGNOSIS — K5904 Chronic idiopathic constipation: Secondary | ICD-10-CM

## 2019-04-17 DIAGNOSIS — R300 Dysuria: Secondary | ICD-10-CM

## 2019-04-17 LAB — POCT URINALYSIS DIP (CLINITEK)
Bilirubin, UA: NEGATIVE
Blood, UA: NEGATIVE
Glucose, UA: NEGATIVE mg/dL
Ketones, POC UA: NEGATIVE mg/dL
Nitrite, UA: NEGATIVE
POC PROTEIN,UA: NEGATIVE
Spec Grav, UA: 1.02 (ref 1.010–1.025)
Urobilinogen, UA: 0.2 E.U./dL
pH, UA: 5.5 (ref 5.0–8.0)

## 2019-04-17 MED ORDER — PHENAZOPYRIDINE HCL 95 MG PO TABS: 95.0000 mg | ORAL_TABLET | Freq: Three times a day (TID) | ORAL | 0 refills | Status: DC | PRN

## 2019-04-17 MED ORDER — POLYETHYLENE GLYCOL 3350 17 GM/SCOOP PO POWD: 17.0000 g | Freq: Two times a day (BID) | ORAL | 1 refills | Status: DC | PRN

## 2019-04-17 MED ORDER — NITROFURANTOIN MONOHYD MACRO 100 MG PO CAPS: 100.0000 mg | ORAL_CAPSULE | Freq: Two times a day (BID) | ORAL | 0 refills | Status: AC

## 2019-04-17 NOTE — Patient Instructions (Signed)
° ° ° °  If you have lab work done today you will be contacted with your lab results within the next 2 weeks.  If you have not heard from us then please contact us. The fastest way to get your results is to register for My Chart. ° ° °IF you received an x-ray today, you will receive an invoice from [facility]. Please contact [facility] at [phone] with questions or concerns regarding your invoice.  ° °IF you received labwork today, you will receive an invoice from [facility]. Please contact [facility] at [phone] with questions or concerns regarding your invoice.  ° °Our billing staff will not be able to assist you with questions regarding bills from these companies. ° °You will be contacted with the lab results as soon as they are available. The fastest way to get your results is to activate your My Chart account. Instructions are located on the last page of this paperwork. If you have not heard from us regarding the results in 2 weeks, please contact this office. °  ° ° ° °

## 2019-04-18 ENCOUNTER — Encounter: Payer: Self-pay | Admitting: Radiology

## 2019-04-18 LAB — CBC
Hematocrit: 44 % (ref 34.0–46.6)
Hemoglobin: 14.3 g/dL (ref 11.1–15.9)
MCH: 27.4 pg (ref 26.6–33.0)
MCHC: 32.5 g/dL (ref 31.5–35.7)
MCV: 84 fL (ref 79–97)
Platelets: 302 10*3/uL (ref 150–450)
RBC: 5.22 x10E6/uL (ref 3.77–5.28)
RDW: 12.6 % (ref 11.7–15.4)
WBC: 6.2 10*3/uL (ref 3.4–10.8)

## 2019-04-18 LAB — COMPREHENSIVE METABOLIC PANEL
ALT: 25 IU/L (ref 0–32)
AST: 30 IU/L (ref 0–40)
Albumin/Globulin Ratio: 1.7 (ref 1.2–2.2)
Albumin: 4.5 g/dL (ref 3.8–4.9)
Alkaline Phosphatase: 148 IU/L — ABNORMAL HIGH (ref 39–117)
BUN/Creatinine Ratio: 12 (ref 9–23)
BUN: 11 mg/dL (ref 6–24)
Bilirubin Total: 0.3 mg/dL (ref 0.0–1.2)
CO2: 21 mmol/L (ref 20–29)
Calcium: 9.3 mg/dL (ref 8.7–10.2)
Chloride: 106 mmol/L (ref 96–106)
Creatinine, Ser: 0.91 mg/dL (ref 0.57–1.00)
GFR calc Af Amer: 83 mL/min/{1.73_m2} (ref >59)
GFR calc non Af Amer: 72 mL/min/{1.73_m2} (ref >59)
Globulin, Total: 2.6 g/dL (ref 1.5–4.5)
Glucose: 99 mg/dL (ref 65–99)
Potassium: 3.8 mmol/L (ref 3.5–5.2)
Sodium: 144 mmol/L (ref 134–144)
Total Protein: 7.1 g/dL (ref 6.0–8.5)

## 2019-04-18 LAB — HEMOGLOBIN A1C
Est. average glucose Bld gHb Est-mCnc: 100 mg/dL
Hgb A1c MFr Bld: 5.1 % (ref 4.8–5.6)

## 2019-04-18 NOTE — Progress Notes (Signed)
Good morning,  If we can send a normal results letter to Ms. Sainato, that would be great.  Thanks,  Jari Sportsman, NP

## 2019-04-22 ENCOUNTER — Other Ambulatory Visit: Payer: Self-pay

## 2019-04-22 ENCOUNTER — Encounter (HOSPITAL_COMMUNITY): Payer: Self-pay | Admitting: Emergency Medicine

## 2019-04-22 ENCOUNTER — Ambulatory Visit (HOSPITAL_COMMUNITY)
Admission: EM | Admit: 2019-04-22 | Discharge: 2019-04-22 | Disposition: A | Discharge status: Home / Self Care | Payer: BLUE CROSS/BLUE SHIELD | Attending: Family Medicine | Admitting: Family Medicine

## 2019-04-22 DIAGNOSIS — Z789 Other specified health status: Secondary | ICD-10-CM

## 2019-04-22 DIAGNOSIS — L0231 Cutaneous abscess of buttock: Secondary | ICD-10-CM

## 2019-04-22 MED ORDER — LIDOCAINE HCL (PF) 1 % IJ SOLN
INTRAMUSCULAR | Status: AC
  Filled 2019-04-22: qty 2

## 2019-04-22 MED ORDER — CEFTRIAXONE SODIUM 500 MG IJ SOLR
500.0000 mg | Freq: Once | INTRAMUSCULAR | Status: AC
  Administered 2019-04-22: 11:00:00 500 mg via INTRAMUSCULAR

## 2019-04-22 MED ORDER — CEFTRIAXONE SODIUM 500 MG IJ SOLR
INTRAMUSCULAR | Status: AC
  Filled 2019-04-22: qty 500

## 2019-04-22 MED ORDER — LIDOCAINE-EPINEPHRINE (PF) 2 %-1:200000 IJ SOLN
INTRAMUSCULAR | Status: AC
  Filled 2019-04-22: qty 20

## 2019-04-22 MED ORDER — CEPHALEXIN 500 MG PO CAPS: 500.0000 mg | ORAL_CAPSULE | Freq: Three times a day (TID) | ORAL | 0 refills | Status: AC

## 2019-04-22 NOTE — ED Triage Notes (Signed)
Patient has an abscess to rectal/buttocks area.  Patient was seen at [facility] recently and received a prescription, but no improvement per patient  No better  Taking azo and nitrofurantoin mono/mac 160m caps  Daughter is with her today for translation purposes

## 2019-04-22 NOTE — ED Provider Notes (Signed)
[place]    CSN: [id number] Arrival date & time: 04/22/19  1004      History   Chief Complaint Chief Complaint  Patient presents with  . Abscess    HPI Theresa Weaver is a 54 y.o. female.   HPI  Theresa Weaver presents today with a complaint of persistent perianal abscess present for 2 weeks. Patient was seen at her PCP office and due to language barrier was treated for a UTI prescribed Macrobid and Azo. Her daughter is present with her today and explains that her mom has had a abscess of the left buttocks for approximately 2 weeks.  Patient has a history of chronic intermittent constipation.  She has had a previous episode for patient in the same area.  She denies any nausea, vomiting, chills.  She endorses severe pain which is exacerbated by sitting.  She has remained afebrile.  History reviewed. No pertinent past medical history.  Patient Active Problem List   Diagnosis Date Noted  . Left lower quadrant pain 08/10/2016  . Generalized abdominal pain 08/10/2016  . Right lower quadrant abdominal tenderness without rebound tenderness 08/10/2016    Past Surgical History:  Procedure Laterality Date  . ABDOMINAL HYSTERECTOMY    . TUBAL LIGATION    . WRIST SURGERY Left     OB History   No obstetric history on file.      Home Medications    Prior to Admission medications   Medication Sig Start Date End Date Taking? Authorizing Provider  ibuprofen (ADVIL) 200 MG tablet Take 200 mg by mouth every 6 (six) hours as needed.   Yes [provider]  nitrofurantoin, macrocrystal-monohydrate, (MACROBID) 100 MG capsule Take 1 capsule (100 mg total) by mouth 2 (two) times daily for 5 days. 04/17/19 04/22/19 Yes Maximiano Coss, NP  phenazopyridine (PYRIDIUM) 95 MG tablet Take 1 tablet (95 mg total) by mouth 3 (three) times daily as needed for pain. 04/17/19  Yes Maximiano Coss, NP  omeprazole (PRILOSEC) 40 MG capsule Take 1 capsule (40 mg total) by mouth daily. Patient  not taking: Reported on 04/17/2019 01/17/18   Tenna Delaine D, PA-C  polyethylene glycol powder (GLYCOLAX/MIRALAX) 17 GM/SCOOP powder Take 17 g by mouth 2 (two) times daily as needed. 04/17/19   Maximiano Coss, NP  polyethylene glycol powder (GLYCOLAX/MIRALAX) powder Take 17 g by mouth as needed.    [provider]  predniSONE (DELTASONE) 50 MG tablet One daily with food Patient not taking: Reported on 04/17/2019 03/23/18   Robyn Haber, MD    Family History Family History  Family history unknown: Yes    Social History Social History   Tobacco Use  . Smoking status: Former Smoker    Years: 4.00  . Smokeless tobacco: Never Used  . Tobacco comment: aa a teenager  Substance Use Topics  . Alcohol use: No    Alcohol/week: 0.0 standard drinks  . Drug use: No     Allergies   Patient has no known allergies.   Review of Systems Review of Systems Pertinent negatives listed in HPI Physical Exam Triage Vital Signs ED Triage Vitals [04/22/19 1025]  Enc Vitals Group     BP      Pulse      Resp      Temp      Temp src      SpO2      Weight      Height      Head Circumference  Peak Flow      Pain Score 10     Pain Loc      Pain Edu?      Excl. in [place]?    No data found.  Updated Vital Signs BP 114/82 (BP Location: Left Arm)   Pulse 84   Temp 97.8 F (36.6 C) (Oral)   Resp 18   SpO2 99%   Visual Acuity Right Eye Distance:   Left Eye Distance:   Bilateral Distance:    Right Eye Near:   Left Eye Near:    Bilateral Near:     Physical Exam   General appearance: alert, well developed, well nourished, cooperative and in no distress Head: Normocephalic, without obvious abnormality, atraumatic Respiratory: Respirations even and unlabored, normal respiratory rate Heart: rate and rhythm normal. No gallop or murmurs noted on exam  Abdomen: BS +, no distention, no rebound tenderness, or no mass Genitourinary: LEFT fluctuate abscess noted at the 9 o'clock  position of the anal opening extending into the left buttocks Extremities: No gross deformities Skin: Skin color, texture, turgor normal. No rashes seen  Psych: Appropriate mood and affect. Neurologic:  Alert, oriented to person, place, and time, thought content appropriate. UC Treatments / Results  Labs (all labs ordered are listed, but only abnormal results are displayed) Labs Reviewed - No data to display  EKG   Radiology No results found.  Procedures Incision and Drainage  Date/Time: 04/22/2019 11:15 AM Performed by: Bing Neighbors, FNP Authorized by: Bing Neighbors, FNP   Consent:    Consent obtained:  Verbal Location:    Type:  Abscess   Location:  Anogenital   Anogenital location:  Perianal (EXTENDING TO LEFT BUTTOCKS ) Pre-procedure details:    Skin preparation:  Betadine Anesthesia (see MAR for exact dosages):    Anesthesia method:  Local infiltration   Local anesthetic:  Lidocaine 2% WITH epi Procedure type:    Complexity:  Complex Procedure details:    Incision types:  Stab incision   Incision depth:  Subcutaneous   Scalpel blade:  11   Wound management:  Probed and deloculated   Drainage:  Serosanguinous and purulent   Drainage amount:  Copious   Wound treatment:  Wound left open   Packing materials:  None Post-procedure details:    Patient tolerance of procedure:  Tolerated well, no immediate complications   (including critical care time)  Medications Ordered in UC Medications - No data to display  Initial Impression / Assessment and Plan / UC Course  I have reviewed the triage vital signs and the nursing notes.  Pertinent labs & imaging results that were available during my care of the patient were reviewed by me and considered in my medical decision making (see chart for details).     Abscess of buttock, left Language barrier I&D performed and tolerated.  Wound dressed with pressure dressing and secured with Tegaderm. Rocephin 500 mg  IM. Start Cephalexin 500 mg TID x 10 days. Return in 3 days for wound check. Discussed signs of infection that warrant further evaluation. Final Clinical Impressions(s) / UC Diagnoses   Final diagnoses:  Abscess of buttock, left  Language barrier   Discharge Instructions   None    ED Prescriptions    Medication Sig Dispense Auth. Provider   cephALEXin (KEFLEX) 500 MG capsule Take 1 capsule (500 mg total) by mouth 3 (three) times daily for 10 days. 30 capsule Bing Neighbors, FNP     PDMP not  reviewed this encounter.   Bing Neighbors, FNP 04/22/19 1236

## 2019-04-24 ENCOUNTER — Encounter: Payer: Self-pay | Admitting: Registered Nurse

## 2019-04-24 DIAGNOSIS — K5904 Chronic idiopathic constipation: Secondary | ICD-10-CM | POA: Insufficient documentation

## 2019-04-24 NOTE — Progress Notes (Signed)
Acute Office Visit  Subjective:    Patient ID: Theresa Weaver, female    DOB: 07-21-1965, 54 y.o.   MRN: [id number]  Chief Complaint  Patient presents with  . Constipation    patient states its been hard to use the restroom both urination and pooping . patient states she take tylenol for the pain .    HPI Patient is in today for dysuria and constipation. Reports that constipation onset within the past month, dysuria within the past week. Both seem to be worsening. Some abdominal discomfort. No nvd. No vaginal symptoms. No blood in stool.  Has not had fever, chills, fatigue. No flank pain.  These symptoms have not happened before Has taken otc analgesics without relief  No past medical history on file.  Past Surgical History:  Procedure Laterality Date  . ABDOMINAL HYSTERECTOMY    . TUBAL LIGATION    . WRIST SURGERY Left     Family History  Family history unknown: Yes    Social History   Socioeconomic History  . Marital status: Married    Spouse name: Not on file  . Number of children: 7  . Years of education: Not on file  . Highest education level: Not on file  Occupational History  . Occupation: house keeping  Tobacco Use  . Smoking status: Former Smoker    Years: 4.00  . Smokeless tobacco: Never Used  . Tobacco comment: aa a teenager  Substance and Sexual Activity  . Alcohol use: No    Alcohol/week: 0.0 standard drinks  . Drug use: No  . Sexual activity: Yes  Other Topics Concern  . Not on file  Social History Narrative  . Not on file   Social Determinants of Health   Financial Resource Strain:   . Difficulty of Paying Living Expenses: Not on file  Food Insecurity:   . Worried About Programme researcher, broadcasting/film/video in the Last Year: Not on file  . Ran Out of Food in the Last Year: Not on file  Transportation Needs:   . Lack of Transportation (Medical): Not on file  . Lack of Transportation (Non-Medical): Not on file  Physical Activity:   . Days of Exercise per  Week: Not on file  . Minutes of Exercise per Session: Not on file  Stress:   . Feeling of Stress : Not on file  Social Connections:   . Frequency of Communication with Friends and Family: Not on file  . Frequency of Social Gatherings with Friends and Family: Not on file  . Attends Religious Services: Not on file  . Active Member of Clubs or Organizations: Not on file  . Attends Banker Meetings: Not on file  . Marital Status: Not on file  Intimate Partner Violence:   . Fear of Current or Ex-Partner: Not on file  . Emotionally Abused: Not on file  . Physically Abused: Not on file  . Sexually Abused: Not on file    Outpatient Medications Prior to Visit  Medication Sig Dispense Refill  . omeprazole (PRILOSEC) 40 MG capsule Take 1 capsule (40 mg total) by mouth daily. (Patient not taking: Reported on 04/17/2019) 30 capsule 0  . polyethylene glycol powder (GLYCOLAX/MIRALAX) powder Take 17 g by mouth as needed.    . predniSONE (DELTASONE) 50 MG tablet One daily with food (Patient not taking: Reported on 04/17/2019) 5 tablet 0   No facility-administered medications prior to visit.    No Known Allergies  Review of Systems  Constitutional: Negative.   HENT: Negative.   Eyes: Negative.   Respiratory: Negative.   Cardiovascular: Negative.   Gastrointestinal: Positive for abdominal pain and constipation. Negative for abdominal distention, anal bleeding, blood in stool, diarrhea, nausea, rectal pain and vomiting.  Endocrine: Negative.   Genitourinary: Positive for dysuria and urgency. Negative for decreased urine volume, difficulty urinating, dyspareunia, enuresis, flank pain, frequency, genital sores, hematuria, menstrual problem, pelvic pain, vaginal bleeding, vaginal discharge and vaginal pain.  Musculoskeletal: Negative.   Skin: Negative.   Allergic/Immunologic: Negative.   Neurological: Negative.   Hematological: Negative.   Psychiatric/Behavioral: Negative.   All other  systems reviewed and are negative.      Objective:    Physical Exam Vitals and nursing note reviewed.  Constitutional:      General: She is not in acute distress.    Appearance: Normal appearance. She is normal weight. She is not ill-appearing, toxic-appearing or diaphoretic.  Cardiovascular:     Rate and Rhythm: Normal rate and regular rhythm.  Pulmonary:     Effort: Pulmonary effort is normal. No respiratory distress.  Abdominal:     General: Abdomen is flat. Bowel sounds are normal. There is no distension.     Palpations: Abdomen is soft. There is no mass.     Tenderness: There is no abdominal tenderness. There is no right CVA tenderness, left CVA tenderness, guarding or rebound.     Hernia: No hernia is present.  Skin:    Capillary Refill: Capillary refill takes less than 2 seconds.  Neurological:     General: No focal deficit present.     Mental Status: She is alert and oriented to person, place, and time. Mental status is at baseline.  Psychiatric:        Mood and Affect: Mood normal.        Behavior: Behavior normal.        Thought Content: Thought content normal.        Judgment: Judgment normal.     BP 129/84   Pulse 80   Temp 98 F (36.7 C) (Temporal)   Ht 4\' 10"  (1.473 m)   Wt 141 lb (64 kg)   SpO2 98%   BMI 29.47 kg/m  Wt Readings from Last 3 Encounters:  04/17/19 141 lb (64 kg)  01/17/18 134 lb 3.2 oz (60.9 kg)  12/05/17 135 lb (61.2 kg)    Health Maintenance Due  Topic Date Due  . HIV Screening  07/17/1979  . PAP SMEAR-Modifier  07/16/1985  . MAMMOGRAM  07/17/2014  . COLONOSCOPY  07/17/2014    There are no preventive care reminders to display for this patient.   No results found for: TSH Lab Results  Component Value Date   WBC 6.2 04/17/2019   HGB 14.3 04/17/2019   HCT 44.0 04/17/2019   MCV 84 04/17/2019   PLT 302 04/17/2019   Lab Results  Component Value Date   NA 144 04/17/2019   K 3.8 04/17/2019   CO2 21 04/17/2019   GLUCOSE 99  04/17/2019   BUN 11 04/17/2019   CREATININE 0.91 04/17/2019   BILITOT 0.3 04/17/2019   ALKPHOS 148 (H) 04/17/2019   AST 30 04/17/2019   ALT 25 04/17/2019   PROT 7.1 04/17/2019   ALBUMIN 4.5 04/17/2019   CALCIUM 9.3 04/17/2019   ANIONGAP 9 10/19/2016   No results found for: CHOL No results found for: HDL No results found for: LDLCALC No results found for: TRIG No results found for: CHOLHDL Lab  Results  Component Value Date   HGBA1C 5.1 04/17/2019       Assessment & Plan:   Problem List Items Addressed This Visit    None    Visit Diagnoses    Dysuria    -  Primary   Relevant Medications   phenazopyridine (PYRIDIUM) 95 MG tablet   Other Relevant Orders   POCT URINALYSIS DIP (CLINITEK) (Completed)   Comprehensive metabolic panel (Completed)   Chronic idiopathic constipation       Relevant Medications   polyethylene glycol powder (GLYCOLAX/MIRALAX) 17 GM/SCOOP powder   Other Relevant Orders   CBC (Completed)   History of elevated glucose       Relevant Orders   Hemoglobin A1c (Completed)   CBC (Completed)   Comprehensive metabolic panel (Completed)       Meds ordered this encounter  Medications  . phenazopyridine (PYRIDIUM) 95 MG tablet    Sig: Take 1 tablet (95 mg total) by mouth 3 (three) times daily as needed for pain.    Dispense:  10 tablet    Refill:  0    Order Specific Question:   Supervising Provider    Answer:   Delia Chimes A [id number]  . polyethylene glycol powder (GLYCOLAX/MIRALAX) 17 GM/SCOOP powder    Sig: Take 17 g by mouth 2 (two) times daily as needed.    Dispense:  3350 g    Refill:  1    Order Specific Question:   Supervising Provider    Answer:   Delia Chimes A [id number]  . nitrofurantoin, macrocrystal-monohydrate, (MACROBID) 100 MG capsule    Sig: Take 1 capsule (100 mg total) by mouth 2 (two) times daily for 5 days.    Dispense:  10 capsule    Refill:  0    Order Specific Question:   Supervising Provider    Answer:   Forrest Moron [id number]   PLAN  Treat based on symptoms for UTI  Given MiraLax and suggest psyllium daily supplement for constipation.  Return to clinic if symptoms worsen or fail to improve. Patient will have to consider colonoscopy preventative screening should symptoms continue, but denies at this time.  Labs drawn, will follow up as warranted  Patient encouraged to call clinic with any questions, comments, or concerns.   Maximiano Coss, NP

## 2019-04-25 ENCOUNTER — Ambulatory Visit (HOSPITAL_COMMUNITY)
Admission: EM | Admit: 2019-04-25 | Discharge: 2019-04-25 | Disposition: A | Discharge status: Home / Self Care | Payer: Medicaid Other | Attending: Family Medicine | Admitting: Family Medicine

## 2019-04-25 ENCOUNTER — Other Ambulatory Visit: Payer: Self-pay

## 2019-04-25 ENCOUNTER — Encounter (HOSPITAL_COMMUNITY): Payer: Self-pay

## 2019-04-25 DIAGNOSIS — Z5189 Encounter for other specified aftercare: Secondary | ICD-10-CM

## 2019-04-25 MED ORDER — TRAMADOL HCL 50 MG PO TABS: 50.0000 mg | ORAL_TABLET | Freq: Four times a day (QID) | ORAL | 0 refills | Status: DC | PRN

## 2019-04-25 NOTE — Discharge Instructions (Addendum)
Finish the antibiotic 3 x a day Take the pain medication as needed Take with food See a primary doctor in follow up

## 2019-04-25 NOTE — ED Triage Notes (Signed)
Pt states she's here for a abscess recheck on her buttock.

## 2019-04-25 NOTE — ED Provider Notes (Signed)
[place]    CSN: [id number] Arrival date & time: 04/25/19  1711      History   Chief Complaint Chief Complaint  Patient presents with  . Wound Check    HPI Theresa Weaver is a 53 y.o. female.   HPI  Patient is taking her antibiotic as prescribed She states the swelling and pain are diminished in her buttocks abscess She has not noticed any drainage She requests medicine for pain.  She states her abscess still hurts when she sits.  She also points out that she has multiple scars from old injuries and trauma that cause her pain. I recommended to patient that she have her primary care doctor on an ongoing basis that she needs pain prescriptions  History reviewed. No pertinent past medical history.  Patient Active Problem List   Diagnosis Date Noted  . Chronic idiopathic constipation 04/24/2019  . Left lower quadrant pain 08/10/2016  . Generalized abdominal pain 08/10/2016  . Right lower quadrant abdominal tenderness without rebound tenderness 08/10/2016    Past Surgical History:  Procedure Laterality Date  . ABDOMINAL HYSTERECTOMY    . TUBAL LIGATION    . WRIST SURGERY Left     OB History   No obstetric history on file.      Home Medications    Prior to Admission medications   Medication Sig Start Date End Date Taking? Authorizing Provider  cephALEXin (KEFLEX) 500 MG capsule Take 1 capsule (500 mg total) by mouth 3 (three) times daily for 10 days. 04/22/19 05/02/19  Scot Jun, FNP  polyethylene glycol powder (GLYCOLAX/MIRALAX) powder Take 17 g by mouth as needed.    [provider]  traMADol (ULTRAM) 50 MG tablet Take 1 tablet (50 mg total) by mouth every 6 (six) hours as needed. 04/25/19   Raylene Everts, MD  omeprazole (PRILOSEC) 40 MG capsule Take 1 capsule (40 mg total) by mouth daily. Patient not taking: Reported on 04/17/2019 01/17/18 04/25/19  Leonie Douglas, PA-C    Family History Family History  Family history unknown:  Yes    Social History Social History   Tobacco Use  . Smoking status: Former Smoker    Years: 4.00  . Smokeless tobacco: Never Used  . Tobacco comment: aa a teenager  Substance Use Topics  . Alcohol use: No    Alcohol/week: 0.0 standard drinks  . Drug use: No     Allergies   Patient has no known allergies.   Review of Systems Review of Systems  Musculoskeletal: Positive for arthralgias and back pain.  Skin: Positive for wound.     Physical Exam Triage Vital Signs ED Triage Vitals  Enc Vitals Group     BP 04/25/19 1813 131/86     Pulse Rate 04/25/19 1813 76     Resp 04/25/19 1813 16     Temp 04/25/19 1813 97.6 F (36.4 C)     Temp Source 04/25/19 1813 Oral     SpO2 04/25/19 1813 100 %     Weight 04/25/19 1812 150 lb (68 kg)     Height --      Head Circumference --      Peak Flow --      Pain Score 04/25/19 1812 4     Pain Loc --      Pain Edu? --      Excl. in [place]? --    No data found.  Updated Vital Signs BP 131/86 (BP Location: Right Arm)  Pulse 76   Temp 97.6 F (36.4 C) (Oral)   Resp 16   Wt 68 kg   SpO2 100%   BMI 31.35 kg/m       Physical Exam Constitutional:      General: She is not in acute distress.    Appearance: She is well-developed.  HENT:     Head: Normocephalic and atraumatic.     Mouth/Throat:     Comments: Mask in place Eyes:     Conjunctiva/sclera: Conjunctivae normal.     Pupils: Pupils are equal, round, and reactive to light.  Cardiovascular:     Rate and Rhythm: Normal rate.  Pulmonary:     Effort: Pulmonary effort is normal. No respiratory distress.  Abdominal:     Palpations: Abdomen is soft.  Genitourinary:    Comments: Abscess noted on right buttock.  2 cm across.  Small amount of drainage.  Minimal tenderness Musculoskeletal:        General: Normal range of motion.     Cervical back: Normal range of motion.  Skin:    General: Skin is warm and dry.  Neurological:     Mental Status: She is alert.       UC Treatments / Results  Labs (all labs ordered are listed, but only abnormal results are displayed) Labs Reviewed - No data to display  EKG   Radiology No results found.  Procedures Procedures (including critical care time)  Medications Ordered in UC Medications - No data to display  Initial Impression / Assessment and Plan / UC Course  I have reviewed the triage vital signs and the nursing notes.  Pertinent labs & imaging results that were available during my care of the patient were reviewed by me and considered in my medical decision making (see chart for details).      Final Clinical Impressions(s) / UC Diagnoses   Final diagnoses:  Visit for wound check     Discharge Instructions     Finish the antibiotic 3 x a day Take the pain medication as needed Take with food See a primary doctor in follow up    ED Prescriptions    Medication Sig Dispense Auth. Provider   traMADol (ULTRAM) 50 MG tablet Take 1 tablet (50 mg total) by mouth every 6 (six) hours as needed. 30 tablet Eustace Moore, MD     I have reviewed the PDMP during this encounter.   Eustace Moore, MD 04/25/19 2216

## 2019-05-20 ENCOUNTER — Encounter (HOSPITAL_COMMUNITY): Payer: Self-pay | Admitting: Emergency Medicine

## 2019-05-20 ENCOUNTER — Ambulatory Visit (HOSPITAL_COMMUNITY)
Admission: EM | Admit: 2019-05-20 | Discharge: 2019-05-20 | Disposition: A | Discharge status: Home / Self Care | Payer: Medicaid Other

## 2019-05-20 ENCOUNTER — Other Ambulatory Visit: Payer: Self-pay

## 2019-05-20 DIAGNOSIS — M7918 Myalgia, other site: Secondary | ICD-10-CM

## 2019-05-20 DIAGNOSIS — L0231 Cutaneous abscess of buttock: Secondary | ICD-10-CM

## 2019-05-20 MED ORDER — NAPROXEN 500 MG PO TABS: 500.0000 mg | ORAL_TABLET | Freq: Two times a day (BID) | ORAL | 0 refills | Status: DC

## 2019-05-20 MED ORDER — AMOXICILLIN-POT CLAVULANATE 875-125 MG PO TABS: 1.0000 | ORAL_TABLET | Freq: Two times a day (BID) | ORAL | 0 refills | Status: DC

## 2019-05-20 NOTE — ED Provider Notes (Signed)
  [facility]   MRN: [id number] DOB: March 22, 1965  Subjective:   Theresa Weaver is a 54 y.o. female presenting for several day history of recurrent left buttock pain.  She was treated for an abscess with incision and drainage, Keflex April 22, 2019.  Patient states that she finished the antibiotic and had improvement until this past week.  She states that today she was able to squeeze some pus out.  Admits that the symptoms are not as bad as they were previously.  No current facility-administered medications for this encounter.  Current Outpatient Medications:  .  polyethylene glycol powder (GLYCOLAX/MIRALAX) powder, Take 17 g by mouth as needed., Disp: , Rfl:  .  traMADol (ULTRAM) 50 MG tablet, Take 1 tablet (50 mg total) by mouth every 6 (six) hours as needed., Disp: 30 tablet, Rfl: 0   No Known Allergies  No past medical history on file.   Past Surgical History:  Procedure Laterality Date  . ABDOMINAL HYSTERECTOMY    . TUBAL LIGATION    . WRIST SURGERY Left     Family History  Family history unknown: Yes    Social History   Tobacco Use  . Smoking status: Former Smoker    Years: 4.00  . Smokeless tobacco: Never Used  . Tobacco comment: aa a teenager  Substance Use Topics  . Alcohol use: No    Alcohol/week: 0.0 standard drinks  . Drug use: No    ROS   Objective:   Vitals: BP 137/78 (BP Location: Right Arm)   Pulse 66   Temp 98.1 F (36.7 C) (Oral)   Resp 18   SpO2 99%   Physical Exam Constitutional:      General: She is not in acute distress.    Appearance: Normal appearance. She is well-developed. She is not ill-appearing, toxic-appearing or diaphoretic.  HENT:     Head: Normocephalic and atraumatic.     Nose: Nose normal.     Mouth/Throat:     Mouth: Mucous membranes are moist.     Pharynx: Oropharynx is clear.  Eyes:     General: No scleral icterus.    Extraocular Movements: Extraocular movements intact.     Pupils: Pupils are equal,  round, and reactive to light.  Cardiovascular:     Rate and Rhythm: Normal rate.  Pulmonary:     Effort: Pulmonary effort is normal.  Genitourinary:   Skin:    General: Skin is warm and dry.  Neurological:     General: No focal deficit present.     Mental Status: She is alert and oriented to person, place, and time.  Psychiatric:        Mood and Affect: Mood normal.        Behavior: Behavior normal.        Thought Content: Thought content normal.        Judgment: Judgment normal.      Assessment and Plan :   1. Abscess of buttock, left   2. Left buttock pain     Area is not amenable to incision and drainage today given physical exam findings.  Will cover with Augmentin given location and high risk to have contact with fecal matter.  Use naproxen for pain control. Counseled patient on potential for adverse effects with medications prescribed/recommended today, strict ER and return-to-clinic precautions discussed, patient verbalized understanding.    Wallis Bamberg, PA-C 05/20/19 1843

## 2019-05-20 NOTE — ED Triage Notes (Signed)
Patient has been seen twice for this and still is not getting better.  Patient complains of rectal abscess.

## 2019-05-20 NOTE — Discharge Instructions (Addendum)
Your wound is not amenable to an incision and drainage today.  I know you had this previously done but today it would be inappropriate.  I will start you on Augmentin to help with recurring developing infection.  Make sure that you use warm compresses to the area 3-5 times a day for about 5 to 10 minutes at a time.  You are okay to use warm soap and water for cleaning your wound.  Come back to the clinic if you develop worsening swelling, redness, fever, nausea, vomiting, belly pain.  I will also provide you with naproxen for pain and inflammation.

## 2019-08-22 ENCOUNTER — Encounter (HOSPITAL_COMMUNITY): Payer: Self-pay

## 2019-08-22 ENCOUNTER — Other Ambulatory Visit: Payer: Self-pay

## 2019-08-22 ENCOUNTER — Ambulatory Visit (HOSPITAL_COMMUNITY)
Admission: EM | Admit: 2019-08-22 | Discharge: 2019-08-22 | Disposition: A | Discharge status: Home / Self Care | Payer: Medicaid Other | Attending: Family Medicine | Admitting: Family Medicine

## 2019-08-22 DIAGNOSIS — L0291 Cutaneous abscess, unspecified: Secondary | ICD-10-CM

## 2019-08-22 DIAGNOSIS — K5900 Constipation, unspecified: Secondary | ICD-10-CM

## 2019-08-22 DIAGNOSIS — G8929 Other chronic pain: Secondary | ICD-10-CM

## 2019-08-22 LAB — POCT URINALYSIS DIP (DEVICE)
Bilirubin Urine: NEGATIVE
Glucose, UA: NEGATIVE mg/dL
Ketones, ur: NEGATIVE mg/dL
Leukocytes,Ua: NEGATIVE
Nitrite: NEGATIVE
Protein, ur: NEGATIVE mg/dL
Specific Gravity, Urine: >=1.03 (ref 1.005–1.030)
Urobilinogen, UA: 0.2 mg/dL (ref 0.0–1.0)
pH: 5.5 (ref 5.0–8.0)

## 2019-08-22 MED ORDER — POLYETHYLENE GLYCOL 3350 17 GM/SCOOP PO POWD: 1.0000 | Freq: Once | ORAL | 0 refills | Status: AC

## 2019-08-22 MED ORDER — BACITRACIN ZINC 500 UNIT/GM EX OINT: 1.0000 "application " | TOPICAL_OINTMENT | Freq: Two times a day (BID) | CUTANEOUS | 0 refills | Status: AC

## 2019-08-22 NOTE — Discharge Instructions (Signed)
Your abdominal pain, back pain and rib pain is most likely to constipation.  MiraLAX as prescribed You can do bacitracin ointment on the abscess 2 times a day. Keep area clean and dry Contacts given for primary care follow-up

## 2019-08-22 NOTE — ED Triage Notes (Signed)
Pt c/o abscess to buttocks, states she has had to have it drained multiple times, last time being 3-4 months ago. Drainage per patient.

## 2019-08-22 NOTE — ED Provider Notes (Signed)
[facility]    CSN: [id number] Arrival date & time: 08/22/19  1207      History   Chief Complaint Chief Complaint  Patient presents with  . Abscess    HPI Theresa Weaver is a 54 y.o. female.   Pt is overall healthy 54 year old female who presents with small abscess with drainage on buttocks. Also complains of rib pain, lower abdominal pain, and lower back pain. Symptoms not alleviated by ibuprofen. Back pain aggravated by bending over. Reports occasionally "warm when urinating." Denies urinary frequency, urgency, pelvic pain. Hx constipation, low back pain, generalized abdominal pain.  ROS per HPI      History reviewed. No pertinent past medical history.  Patient Active Problem List   Diagnosis Date Noted  . Chronic idiopathic constipation 04/24/2019  . Left lower quadrant pain 08/10/2016  . Generalized abdominal pain 08/10/2016  . Right lower quadrant abdominal tenderness without rebound tenderness 08/10/2016    Past Surgical History:  Procedure Laterality Date  . ABDOMINAL HYSTERECTOMY    . TUBAL LIGATION    . WRIST SURGERY Left     OB History   No obstetric history on file.      Home Medications    Prior to Admission medications   Medication Sig Start Date End Date Taking? Authorizing Provider  bacitracin ointment Apply 1 application topically 2 (two) times daily. 08/22/19   Dahlia Byes A, NP  IBUPROFEN PO Take by mouth.    [provider]  omeprazole (PRILOSEC) 40 MG capsule Take 1 capsule (40 mg total) by mouth daily. Patient not taking: Reported on 04/17/2019 01/17/18 04/25/19  Magdalene River, PA-C    Family History Family History  Family history unknown: Yes    Social History Social History   Tobacco Use  . Smoking status: Former Smoker    Years: 4.00  . Smokeless tobacco: Never Used  . Tobacco comment: aa a teenager  Vaping Use  . Vaping Use: Never used  Substance Use Topics  . Alcohol use: No    Alcohol/week: 0.0  standard drinks  . Drug use: No     Allergies   Patient has no known allergies.   Review of Systems Review of Systems  Constitutional: Negative for fatigue and fever.  Respiratory: Negative.   Cardiovascular: Negative for chest pain and leg swelling.  Gastrointestinal: Positive for abdominal pain and constipation. Negative for blood in stool, diarrhea, nausea and vomiting.  Genitourinary: Negative for difficulty urinating, flank pain, frequency, hematuria, pelvic pain, urgency and vaginal pain.       "warm feeling when urinating" but doesn't burn  Musculoskeletal: Positive for back pain.     Physical Exam Triage Vital Signs ED Triage Vitals  Enc Vitals Group     BP 08/22/19 1254 125/73     Pulse Rate 08/22/19 1253 73     Resp 08/22/19 1253 16     Temp 08/22/19 1253 98.3 F (36.8 C)     Temp src --      SpO2 --      Weight --      Height --      Head Circumference --      Peak Flow --      Pain Score 08/22/19 1420 0     Pain Loc --      Pain Edu? --      Excl. in GC? --    No data found.  Updated Vital Signs BP 125/73   Pulse 73  Temp 98.3 F (36.8 C)   Resp 16   Visual Acuity Right Eye Distance:   Left Eye Distance:   Bilateral Distance:    Right Eye Near:   Left Eye Near:    Bilateral Near:     Physical Exam Constitutional:      General: She is not in acute distress.    Appearance: Normal appearance.  HENT:     Head: Normocephalic.  Cardiovascular:     Rate and Rhythm: Normal rate and regular rhythm.     Pulses: Normal pulses.  Pulmonary:     Effort: Pulmonary effort is normal.  Abdominal:     Palpations: Abdomen is soft.     Tenderness: There is no abdominal tenderness.  Musculoskeletal:        General: No tenderness, deformity or signs of injury.     Cervical back: Normal and normal range of motion.     Thoracic back: Normal.     Lumbar back: No deformity or signs of trauma. Normal range of motion. Positive right straight leg raise test  and positive left straight leg raise test.  Skin:    General: Skin is warm and dry.  Neurological:     General: No focal deficit present.     Mental Status: She is alert.     GCS: GCS eye subscore is 4. GCS verbal subscore is 5. GCS motor subscore is 6.     Cranial Nerves: Cranial nerves are intact.     Sensory: Sensation is intact.     Motor: Motor function is intact.     Coordination: Coordination is intact.     Gait: Gait is intact.  Psychiatric:        Mood and Affect: Mood normal.        Behavior: Behavior normal.      UC Treatments / Results  Labs (all labs ordered are listed, but only abnormal results are displayed) Labs Reviewed  POCT URINALYSIS DIP (DEVICE) - Abnormal; Notable for the following components:      Result Value   Hgb urine dipstick TRACE (*)    All other components within normal limits    EKG   Radiology No results found.  Procedures Procedures (including critical care time)  Medications Ordered in UC Medications - No data to display  Initial Impression / Assessment and Plan / UC Course  I have reviewed the triage vital signs and the nursing notes.  Pertinent labs & imaging results that were available during my care of the patient were reviewed by me and considered in my medical decision making (see chart for details).     Abscess Very small and draining on its own.  No induration. We will have her do bacitracin ointment and monitor  Constipation Patient has chronic constipation.  Treating with MiraLAX. No acute abdomen on exam No concerning red flags.  Chronic bilateral back pain with sciatica Ibuprofen for pain as needed Follow up as needed for continued or worsening symptoms  Final Clinical Impressions(s) / UC Diagnoses   Final diagnoses:  Abscess  Constipation, unspecified constipation type  Chronic bilateral low back pain with bilateral sciatica     Discharge Instructions     Your abdominal pain, back pain and rib pain  is most likely to constipation.  MiraLAX as prescribed You can do bacitracin ointment on the abscess 2 times a day. Keep area clean and dry Contacts given for primary care follow-up    ED Prescriptions    Medication  Sig Dispense Auth. Provider   polyethylene glycol powder (GLYCOLAX/MIRALAX) 17 GM/SCOOP powder Take 255 g by mouth once for 1 dose. 255 g Wanette Robison A, NP   bacitracin ointment Apply 1 application topically 2 (two) times daily. 120 g Loura Halt A, NP     PDMP not reviewed this encounter.   Orvan July, NP 08/23/19 [phone]

## 2019-12-18 ENCOUNTER — Emergency Department (HOSPITAL_COMMUNITY)
Admission: EM | Admit: 2019-12-18 | Discharge: 2019-12-18 | Disposition: A | Discharge status: Home / Self Care | Payer: Medicaid Other | Attending: Emergency Medicine | Admitting: Emergency Medicine

## 2019-12-18 DIAGNOSIS — U071 COVID-19: Secondary | ICD-10-CM | POA: Insufficient documentation

## 2019-12-18 DIAGNOSIS — Z87891 Personal history of nicotine dependence: Secondary | ICD-10-CM | POA: Insufficient documentation

## 2019-12-18 LAB — RESPIRATORY PANEL BY RT PCR (FLU A&B, COVID)
Influenza A by PCR: NEGATIVE
Influenza B by PCR: NEGATIVE
SARS Coronavirus 2 by RT PCR: POSITIVE — AB

## 2019-12-18 MED ORDER — BENZONATATE 100 MG PO CAPS: 100.0000 mg | ORAL_CAPSULE | Freq: Three times a day (TID) | ORAL | 0 refills | Status: DC

## 2019-12-18 MED ORDER — ONDANSETRON 4 MG PO TBDP: 4.0000 mg | ORAL_TABLET | Freq: Three times a day (TID) | ORAL | 0 refills | Status: AC | PRN

## 2019-12-18 NOTE — ED Triage Notes (Signed)
Pt arrives to ED with chief complaint of cough, her significant other tested positive for covid on Friday 10/08 and she developed a cough yesterday. No sob, cp or fevers.

## 2019-12-18 NOTE — ED Provider Notes (Signed)
[facility] EMERGENCY DEPARTMENT Provider Note   CSN: [id number] Arrival date & time: 12/18/19  2778     History Chief Complaint  Patient presents with  . Cough    Theresa Weaver is a 54 y.o. female.  54 yo F with a chief complaints of cough.  Her husband has been diagnosed recently with novel coronavirus.  She has no other symptoms no shortness of breath no fevers no nausea vomiting abdominal pain.  She is concerned that she should not go to work.  The history is provided by the patient.  Cough Associated symptoms: no chest pain, no chills, no fever, no headaches, no myalgias, no rhinorrhea, no shortness of breath and no wheezing   Illness Severity:  Moderate Onset quality:  Gradual Duration:  1 day Timing:  Constant Progression:  Worsening Chronicity:  New Associated symptoms: cough   Associated symptoms: no chest pain, no congestion, no fever, no headaches, no myalgias, no nausea, no rhinorrhea, no shortness of breath, no vomiting and no wheezing        No past medical history on file.  Patient Active Problem List   Diagnosis Date Noted  . Chronic idiopathic constipation 04/24/2019  . Left lower quadrant pain 08/10/2016  . Generalized abdominal pain 08/10/2016  . Right lower quadrant abdominal tenderness without rebound tenderness 08/10/2016    Past Surgical History:  Procedure Laterality Date  . ABDOMINAL HYSTERECTOMY    . TUBAL LIGATION    . WRIST SURGERY Left      OB History   No obstetric history on file.     Family History  Family history unknown: Yes    Social History   Tobacco Use  . Smoking status: Former Smoker    Years: 4.00  . Smokeless tobacco: Never Used  . Tobacco comment: aa a teenager  Vaping Use  . Vaping Use: Never used  Substance Use Topics  . Alcohol use: No    Alcohol/week: 0.0 standard drinks  . Drug use: No    Home Medications Prior to Admission medications   Medication Sig Start Date End Date  Taking? Authorizing Provider  bacitracin ointment Apply 1 application topically 2 (two) times daily. 08/22/19   Dahlia Byes A, NP  benzonatate (TESSALON) 100 MG capsule Take 1 capsule (100 mg total) by mouth every 8 (eight) hours. 12/18/19   Melene Plan, DO  IBUPROFEN PO Take by mouth.    [provider]  ondansetron (ZOFRAN ODT) 4 MG disintegrating tablet Take 1 tablet (4 mg total) by mouth every 8 (eight) hours as needed for nausea or vomiting. 12/18/19   Melene Plan, DO  omeprazole (PRILOSEC) 40 MG capsule Take 1 capsule (40 mg total) by mouth daily. Patient not taking: Reported on 04/17/2019 01/17/18 04/25/19  Benjiman Core D, PA-C    Allergies    Patient has no known allergies.  Review of Systems   Review of Systems  Constitutional: Negative for chills and fever.  HENT: Negative for congestion and rhinorrhea.   Eyes: Negative for redness and visual disturbance.  Respiratory: Positive for cough. Negative for shortness of breath and wheezing.   Cardiovascular: Negative for chest pain and palpitations.  Gastrointestinal: Negative for nausea and vomiting.  Genitourinary: Negative for dysuria and urgency.  Musculoskeletal: Negative for arthralgias and myalgias.  Skin: Negative for pallor and wound.  Neurological: Negative for dizziness and headaches.    Physical Exam Updated Vital Signs BP (!) 161/98 (BP Location: Right Arm)   Pulse 76  Temp 97.7 F (36.5 C) (Oral)   Resp 16   SpO2 100%   Physical Exam Vitals and nursing note reviewed.  Constitutional:      General: She is not in acute distress.    Appearance: She is well-developed. She is not diaphoretic.  HENT:     Head: Normocephalic and atraumatic.  Eyes:     Pupils: Pupils are equal, round, and reactive to light.  Cardiovascular:     Rate and Rhythm: Normal rate and regular rhythm.     Heart sounds: No murmur heard.  No friction rub. No gallop.   Pulmonary:     Effort: Pulmonary effort is normal.      Breath sounds: No wheezing or rales.  Abdominal:     General: There is no distension.     Palpations: Abdomen is soft.     Tenderness: There is no abdominal tenderness.  Musculoskeletal:        General: No tenderness.     Cervical back: Normal range of motion and neck supple.  Skin:    General: Skin is warm and dry.  Neurological:     Mental Status: She is alert and oriented to person, place, and time.  Psychiatric:        Behavior: Behavior normal.     ED Results / Procedures / Treatments   Labs (all labs ordered are listed, but only abnormal results are displayed) Labs Reviewed  RESPIRATORY PANEL BY RT PCR (FLU A&B, COVID) - Abnormal; Notable for the following components:      Result Value   SARS Coronavirus 2 by RT PCR POSITIVE (*)    All other components within normal limits    EKG None  Radiology No results found.  Procedures Procedures (including critical care time)  Medications Ordered in ED Medications - No data to display  ED Course  I have reviewed the triage vital signs and the nursing notes.  Pertinent labs & imaging results that were available during my care of the patient were reviewed by me and considered in my medical decision making (see chart for details).    MDM Rules/Calculators/A&P                          54 yo F with a chief complaint of a cough.  Is also a recent close contact with someone with the novel coronavirus.  Found to be positive here.  Will write out of work for 10 days in accordance with most recent CDC guidelines.  Cough and nausea medicine as needed.  Follow-up given for the post Covid clinic.  Theresa Weaver was evaluated in Emergency Department on 12/18/2019 for the symptoms described in the history of present illness. He/she was evaluated in the context of the global COVID-19 pandemic, which necessitated consideration that the patient might be at risk for infection with the SARS-CoV-2 virus that causes COVID-19. Institutional  protocols and algorithms that pertain to the evaluation of patients at risk for COVID-19 are in a state of rapid change based on information released by regulatory bodies including the CDC and federal and state organizations. These policies and algorithms were followed during the patient's care in the ED.  10:23 AM:  I have discussed the diagnosis/risks/treatment options with the patient and believe the pt to be eligible for discharge home to follow-up with PCP, covid clinic. We also discussed returning to the ED immediately if new or worsening sx occur. We discussed the sx which  are most concerning (e.g., sob, inability to tolerate by mouth) that necessitate immediate return. Medications administered to the patient during their visit and any new prescriptions provided to the patient are listed below.  Medications given during this visit Medications - No data to display   The patient appears reasonably screen and/or stabilized for discharge and I doubt any other medical condition or other [facility] requiring further screening, evaluation, or treatment in the ED at this time prior to discharge.   Final Clinical Impression(s) / ED Diagnoses Final diagnoses:  COVID-19 virus infection    Rx / DC Orders ED Discharge Orders         Ordered    benzonatate (TESSALON) 100 MG capsule  Every 8 hours        12/18/19 1009    ondansetron (ZOFRAN ODT) 4 MG disintegrating tablet  Every 8 hours PRN        12/18/19 1009           Melene Plan, DO 12/18/19 1023

## 2019-12-18 NOTE — Discharge Instructions (Addendum)
Take tylenol 2 pills 4 times a day and motrin 4 pills 3 times a day.  Drink plenty of fluids.  Return for worsening shortness of breath, headache, confusion. Follow up with your family doctor.   You have been diagnosed with the coronavirus.  Please try to self isolate.  The big issue that brings people to the hospital is difficulty breathing.  Please return to the ED if you experience that.  There is a coronavirus clinic that is available.  I have attached the information in your paperwork.  Take the cough and nausea medicine as needed.  U?ng tylenol 2 vin 4 l?n m?t ngy v motrin 4 vin 3 l?n m?t ngy. U?ng nhi?u n??c. Tr? l?i tnh tr?ng kh th?, nh?c ??u, l l?n ngy cng tr?m tr?ng h?n. Theo di v?i bc s? gia ?nh c?a b?n.  B?n ? ???c ch?n ?on m?c b?nh coronavirus. Hy c? g?ng t? c l?p. V?n ?? l?n khi?n m?i ng??i ??n b?nh vi?n l kh th?. Vui lng quay l?i ED n?u b?n g?p ph?i ?i?u ?. C m?t phng khm coronavirus c s?n. Ti ? ?nh km thng tin trong th? t?c gi?y t? c?a b?n. U?ng thu?c ho v bu?n nn khi c?n thi?t.

## 2019-12-19 ENCOUNTER — Telehealth (HOSPITAL_COMMUNITY): Payer: Self-pay

## 2019-12-19 ENCOUNTER — Ambulatory Visit (HOSPITAL_COMMUNITY)
Admission: RE | Admit: 2019-12-19 | Discharge: 2019-12-19 | Disposition: A | Discharge status: Home / Self Care | Payer: Commercial Managed Care - PPO | Source: Ambulatory Visit | Attending: Pulmonary Disease | Admitting: Pulmonary Disease

## 2019-12-19 ENCOUNTER — Other Ambulatory Visit: Payer: Self-pay | Admitting: Physician Assistant

## 2019-12-19 DIAGNOSIS — E663 Overweight: Secondary | ICD-10-CM | POA: Diagnosis present

## 2019-12-19 DIAGNOSIS — U071 COVID-19: Secondary | ICD-10-CM | POA: Diagnosis not present

## 2019-12-19 MED ORDER — METHYLPREDNISOLONE SODIUM SUCC 125 MG IJ SOLR: 125.0000 mg | Freq: Once | INTRAMUSCULAR | Status: DC | PRN

## 2019-12-19 MED ORDER — SODIUM CHLORIDE 0.9 % IV SOLN: Freq: Once | INTRAVENOUS | Status: AC

## 2019-12-19 MED ORDER — EPINEPHRINE 0.3 MG/0.3ML IJ SOAJ: 0.3000 mg | Freq: Once | INTRAMUSCULAR | Status: DC | PRN

## 2019-12-19 MED ORDER — DIPHENHYDRAMINE HCL 50 MG/ML IJ SOLN: 50.0000 mg | Freq: Once | INTRAMUSCULAR | Status: DC | PRN

## 2019-12-19 MED ORDER — ALBUTEROL SULFATE HFA 108 (90 BASE) MCG/ACT IN AERS: 2.0000 | INHALATION_SPRAY | Freq: Once | RESPIRATORY_TRACT | Status: DC | PRN

## 2019-12-19 MED ORDER — ACETAMINOPHEN 325 MG PO TABS
650.0000 mg | ORAL_TABLET | ORAL | Status: AC
  Administered 2019-12-19: 650 mg via ORAL
  Filled 2019-12-19: qty 2

## 2019-12-19 MED ORDER — FAMOTIDINE IN NACL 20-0.9 MG/50ML-% IV SOLN: 20.0000 mg | Freq: Once | INTRAVENOUS | Status: DC | PRN

## 2019-12-19 MED ORDER — SODIUM CHLORIDE 0.9 % IV SOLN: INTRAVENOUS | Status: DC | PRN

## 2019-12-19 NOTE — Progress Notes (Signed)
  Diagnosis: COVID-19  Physician: Dr. Wright  Procedure: Covid Infusion Clinic Med: bamlanivimab\etesevimab infusion - Provided patient with bamlanimivab\etesevimab fact sheet for patients, parents and caregivers prior to infusion.  Complications: No immediate complications noted.  Discharge: Discharged home   Chaun Uemura M Grenda Lora 12/19/2019  

## 2019-12-19 NOTE — Discharge Instructions (Signed)
COVID-19 COVID-19 COVID-19 l b?nh nhi?m trng ???ng h h?p do m?t lo?i vi rt tn l coronavirus gy h?i ch?ng h h?p c?p n?ng 2 (SARS-CoV-2) gy ra. B?nh ny cn ???c g?i l b?nh do coronavirus ho?c coronavirus ch?ng m?i. ? m?t s? ng??i, vi rt ny c th? khng gy ra b?t k? tri?u ch?ng no. ? nh?ng ng??i khc, n c th? gy nhi?m trng nghim tr?ng. Nhi?m trng c th? nhanh chng tr? nn tr?m tr?ng h?n v c th? d?n ??n cc bi?n ch?ng, ch?ng h?n nh?:  Vim ph?i, ho?c nhi?m trng ph?i.  H?i ch?ng suy h h?p c?p hay ARDS. ?y l tnh tr?ng d?ch tch t? trong ph?i ng?n khng cho ph?i n?p ??y khng kh v ??a oxy vo mu.  Suy h h?p c?p. ?y l tnh tr?ng trong ? khng c ?? oxy ?i t? ph?i ??n c? th? ho?c khi carbon dioxide khng ?i t? ph?i ra kh?i c? th?.  Nhi?m trng huy?t ho?c s?c nhi?m khu?n. ?y l ph?n ?ng nghim tr?ng c?a c? th? v?i nhi?m trng.  Cc v?n ?? ?ng mu.  Nhi?m trng th? pht do vi khu?n ho?c n?m.  Suy c? quan. Tnh tr?ng ny x?y ra khi cc c? quan trong c? th? qu v? ng?ng ho?t ??ng. Vi rt gy COVID-19 d? ly lan. ?i?u ny c ngh?a l vi rt c th? ly lan t? ng??i sang ng??i qua cc gi?t b?n khi ho v h?t h?i (d?ch ti?t ???ng h h?p). Nguyn nhn g gy ra? B?nh ny do vi rt gy ra. Quy? vi? co? th? b? nhi?m vi rt do:  Ht ph?i cc gi?t b?n c?a ng??i b? nhi?m b?nh. Gi?t b?n c th? ly lan khi m?t ng??i ht th?, ni chuy?n, ht, ho ho?c h?t h?i.  Ch?m vo v?t g ?, nh? l bn ho?c tay n?m c?a, ? ti?p xc v?i vi rt (? b? nhi?m b?n) v sau ? ch?m vo mi?ng, m?i, ho?c m?t c?a qu v?. ?i?u g lm t?ng nguy c?? Nguy c? nhi?m trng Qu v? d? b? nhi?m vi rt ny h?n n?u qu v?:  Cch m?t ng??i b? COVID-19 trong vng 6 feet (2 mt).  Ch?m [place] ho?c s?ng chung v?i ng??i b? nhi?m COVID-19.  Dnh th?i gian trong khng gian trong nh ?ng ?c ho?c s?ng chung trong nh. Nguy c? b? b?nh nghim tr?ng Qu v? d? b? b?nh nghim tr?ng do vi rt ny h?n n?u qu v?:  T?  50 tu?i tr? ln. Tu?i cng cao, qu v? cng c nguy c? b? b?nh nghim tr?ng.  S?ng trong nh ?i?u d??ng ho?c c? s? ch?m [place] di h?n.  B? ung th?.  C b?nh ko di (m?n tnh), ch?ng h?n nh?: ? B?nh ph?i m?n tnh, bao g?m b?nh ph?i t?c ngh?n m?n tnh ho?c hen suy?n. ? M?t b?nh ko di lm gi?m kh? n?ng ch?ng l?i nhi?m trng c?a c? th? (b? suy gi?m mi?n d?ch). ? B?nh tim, bao g?m suy tim, m?t tnh tr?ng trong ? cc ??ng m?ch d?n ??n tim b? h?p ho?c b? t?c (b?nh ??ng m?ch vnh), m?t b?nh lm cho c? tim dy ln, y?u ho?c c?ng (b?nh c? tim). ? Ti?u ???ng. ? B?nh th?n m?n tnh. ? B?nh h?ng c?u hnh li?m l m?t tnh tr?ng b?nh l trong ? h?ng c?u c d?ng hnh "li?m" b?t th??ng. ? B?nh gan.  Bo ph. C cc d?u hi?u ho?c tri?u ch?ng g? Cc tri?u ch?ng c?a tnh tr?ng ny  c th? t? nh? ??n n?ng. Tri?u ch?ng c th? xu?t hi?n b?t k? lc no t? 2 ??n 14 ngy sau khi qu v? ph?i nhi?m v?i vi rt. Cc tri?u ch?ng ? bao g?m:  S?t ho?c ?n l?nh.  Ho.  Kh th?.  ?au ??u, ?au nh?c c? th?, ho?c ?au nh?c c?.  Ch?y n??c m?i ho?c ng?t (ngh?t) m?i.  ?au h?ng.  M?t v? gic ho?c kh?u gic m?i x?y ra. M?t s? ng??i c?ng c th? b? cc v?n ?? ? b?ng, ch?ng h?n nh? bu?n nn, nn i ho?c tiu ch?y. Nh?ng ng??i khc c th? khng c b?t k? tri?u ch?ng no c?a COVID-19. Ch?n ?on tnh tr?ng ny nh? th? no? Tnh tr?ng ny c th? ???c ch?n ?on d?a vo:  Cc d?u hi?u v tri?u ch?ng c?a qu v?, ??c bi?t l n?u: ? Qu v? s?ng trong khu v?c c ??t bng pht d?ch COVID-19. ? G?n ?y qu v? ? ?i ??n ho?c ?i t? khu v?c hay c vi rt ny. ? Qu v? ch?m [place] ho?c s?ng chung v?i ng??i ? c ch?n ?on l b? COVID-19. ? Qu v? ? ti?p xc v?i m?t ng??i c ch?n ?on l b? COVID-19.  Khm th?c th?.  Cc xt nghi?m, c th? bao g?m: ? L?y m?t m?u d?ch ? thnh sau m?i v h?ng (d?ch m?i h?ng), m?i ho?c h?ng c?a qu v? b?ng cch dng t?m bng. ? M?t m?u d?ch nh?y ? ph?i (??m) c?a qu v?. ? Xt nghi?m mu.  Cc ki?m  tra hnh ?nh, c th? bao g?m ch?p X quang, ch?p CT (c?t l?p vi tnh), ho?c siu m. Tnh tr?ng ny ???c ?i?u tr? nh? th? no? Hi?n t?i, khng c thu?c ?i?u tr? COVID-19. Cc thu?c ?i?u tr? cc b?nh khc ?ang ???c s? d?ng trn c? s? th? nghi?m ?? xem c hi?u qu? ch?ng l?i COVID-19 hay khng. Chuyn gia ch?m [place] s?c kh?e s? trao ??i v?i qu v? v? cch ?i?u tr? cc tri?u ch?ng c?a qu v?. ??i v?i h?u h?t m?i ng??i, nhi?m trng ? m?c nh? v c th? ???c x? tr t?i nh b?ng cch ngh? ng?i, dng ?? l?ng v thu?c khng k ??n. ?i?u tr? nhi?m trng nghim tr?ng th??ng di?n ra ? khoa ?i?u tr? tch c?c (ICU) c?a b?nh vi?n. C th? bao g?m m?t ho?c nhi?u bi?n pha?p ?i?u tr? sau ?y. Nh?ng bi?n php ?i?u tr? ny ???c p d?ng cho ??n khi cc tri?u ch?ng c?a qu v? c?i thi?n.  Dng cc lo?i d?ch v thu?c qua ???ng t?nh m?ch.  B? sung thm  xi.  xi b? sung ???c cho dng qua m?t ci ?ng ? m?i, m?t n?, ho?c m? trm ??u.  Qu v? ? t? th? n?m s?p (t? th? p s?p). T? th? ny lm cho  xi d? vo ph?i h?n.  My c p su?t ???ng th? d??ng lin t?c (CPAP) ho?c p su?t ???ng th? d??ng hai m?c (BPAP). Bi?n php ?i?u tr? ny s? d?ng p su?t kh nh? ?? gi? cho ???ng th? khai thng. M?t ?ng ???c k?t n?i v?i m?t ??ng c? phn ph?i  xi ??n c? th?.  My th?. Bi?n php ?i?u tr? ny ??a khng kh vo v ra kh?i ph?i b?ng cch s? d?ng m?t ?ng ??t vo kh qu?n c?a qu v?.  M? thng kh qu?n. ?y l m?t th? thu?t t?o m?t l? ? c? ?? c th? ??a ?ng th? vo.  Trao ??i  xi qua mng ngoi c? th? (ECMO). Th? thu?t ny cho ph?i c? h?i h?i ph?c b?ng cch ??m nhi?m cc ch?c n?ng c?a tim v ph?i. N cung c?p  xi cho c? th? v lo?i b? carbon dioxide. Tun th? nh?ng h??ng d?n ny ? nh: L?i s?ng  N?u qu v? b? ?m, hy ? nh tr? khi c?n ?i khm. Chuyn gia ch?m so?c s??c kho?e se? cho bi?t qu v? c?n ? nh bao lu. G?i cho chuyn gia ch?m [place] s?c kh?e tr??c khi qu v? ?i khm.  Ngh? ng?i ? nh theo ch? d?n c?a chuyn gia ch?m [place] s?c  kh?e.  Khng s? d?ng b?t k? s?n ph?m no c nicotine ho?c thu?c l, ch?ng ha?n nh? thu?c l d?ng ht, thu?c l ?i?n t? v thu?c l d?ng nhai. N?u qu v? c?n gip ?? ?? cai thu?c, hy h?i chuyn gia ch?m [place] s?c kh?e.  Tr? l?i sinh ho?t bnh th??ng theo ch? d?n c?a chuyn gia ch?m [place] s?c kh?e. Hy h?i chuyn gia ch?m [place] s?c kh?e v? cc ho?t ??ng no l an ton cho qu v?. H??ng d?n chung  Ch? s? d?ng thu?c khng k ??n v thu?c k ??n theo ch? d?n c?a chuyn gia ch?m [place] s?c kh?e.  U?ng ?? n??c ?? gi? cho n??c ti?u c mu vng nh?t.  Tun th? t?t c? cc l?n khm theo di theo ch? d?n c?a chuyn gia ch?m [place] s?c kh?e. ?i?u ny c vai tr quan tr?ng. Ng?n ng?a tnh tr?ng ny nh? th? no?  Khng c v?c xin no gip ng?n ng?a nhi?m COVID-19. Tuy nhin, c cc b??c qu v? c th? th?c hi?n ?? b?o v? b?n thn v nh?ng ng??i khc kh?i vi rt ny. ?? b?o v? b?n thn:   Khng ?i ??n cc khu v?c c nguy c? v? COVID-19. Cc khu v?c n?i c bo co v? COVID-19 th??ng xuyn thay ??i. ?? xc ??nh cc khu v?c c nguy c? cao v h?n ch? ?i l?i, hy ki?m tra [employer] l?ch c?a CDC: [employer].si  N?u qu v? s?ng ?, ho?c ph?i ?i ??n, khu v?c m COVID-19 l m?t nguy c?, hy th?c hi?n cc bi?n php phng ng?a ?? trnh nhi?m b?nh. ? [place] xa nh?ng ng??i b? ?m. ? R?a tay th??ng xuyn trong 20 giy b?ng x phng v n??c. N?u khng c s?n x phng v n??c, hy s? d?ng thu?c st khu?n tay c c?n. ? Trnh ch?m tay ln mi?ng, m?t, m?t ho?c m?i. ? Trnh ?i ??n n?i cng c?ng, tun theo h??ng d?n c?a cc c? [place] y t? c th?m quy?n c?a ti?u bang v ??a ph??ng. ? N?u qu v? ph?i ?i ra ngoi, hy ?eo kh?u trang v?i ho?c m?t n?. ??m b?o r?ng kh?u trang che m?i v mi?ng qu v?. ? Trnh cc khng gian trong nh ?ng ?c. ? cch xa nh?ng ng??i khc t nh?t 6 feet (2 mt). ? Kh? trng cc ?? v?t v b? m?t th??ng xuyn ch?m vo m?i ngy. Cc ?? v?t v b? m?t ny c th? bao g?m:  Cc qu?y v bn.  Tay n?m c?a  v cng t?c ?n.  B?n r?a v vi n??c.  Cc thi?t b? ?i?n t?, nh? ?i?n tho?i, ?i?u khi?n t? xa, bn phm, my tnh v my tnh b?ng. ?? b?o v? ng??i khc: N?u qu v? c cc tri?u ch?ng c?a COVID-19, hy th?c hi?n cc b??c ?? trnh vi rt ly lan  sang ng??i khc.  N?u qu v? ngh? r?ng qu v? b? nhi?m COVID-19, hy lin h? ngay v?i chuyn gia ch?m [place] s?c kh?e. Hy cho nhm ch?m [place] s?c kh?e bi?t n?u qu v? ngh? r?ng qu v? c th? b? nhi?m COVID-19.  ? nh. Ch? ra kh?i nh ?? ?i khm. Khng s? d?ng ph??ng ti?n giao thng cng c?ng.  Khng ?i du l?ch trong [employer] v? b? ?m.  R?a tay th??ng xuyn trong 20 giy b?ng x phng v n??c. N?u khng c s?n x phng v n??c, hy s? d?ng thu?c st trng tay c c?n.  [place] xa cc thnh vin khc trong gia ?nh c?a qu v?. Hy nh? cc thnh vin kh?e m?nh trong gia ?nh ch?m [place] con ci v v?t nui, n?u c th?. N?u qu v? ph?i ch?m [place] con ci ho?c v?t nui, hy r?a tay th??ng xuyn v ?eo kh?u trang. N?u c th?, hy ? trong phng c?a ring qu v?, ring bi?t v?i nh?ng ng??i khc. S? d?ng phng t?m khc.  Ha?y ba?o ?a?m cho t?t c? mo?i ng???i trong nha? quy? vi? r??a tay ky? va? th???ng xuyn.  Ho ho?c h?t h?i vo m?t ci kh?n gi?y ho?c tay o ho?c khu?u tay c?a qu v?. Khng ho ho?c h?t h?i vo bn tay ho?c vo khng kh.  ?eo kh?u trang v?i ho?c m?t n?. ??m b?o r?ng kh?u trang che m?i v mi?ng qu v?. N?i tm ki?m thm thng tin  Centers for Disease Control and Prevention (Trung tm Ki?m sot v Phng ng?a D?ch b?nh): StickerEm[employer]ium.tn  World Health Organization (T? ch?c Y t? Th? gi?i): [email or web address]://thompson-craig.com/ Hy lin l?c v?i chuyn gia ch?m [place] s?c kh?e n?u:  Qu v? s?ng ho?c ? ?i ??n khu v?c c nguy c? v? COVID-19 v qu v? c cc tri?u ch?ng nhi?m b?nh.  Qu v? ? ti?p xc v?i ai ? nhi?m COVID-19 v qu v? c cc tri?u ch?ng nhi?m b?nh. Yu c?u tr? gip ngay l?p t?c n?u:  Qu v? b? kh th?.  Qu  v? b? ?au ho?c t?c ng?c.  Qu v? b? l l?n.  Qu v? b? xanhh ti ? mi v mng tay.  Qu v? kh th?c d?y khi ?ang ng?[place] Heads v? c cc tri?u ch?ng tr?m tr?ng h?n. Nh?ng tri?u ch?ng ny c th? l bi?u hi?n c?a m?t v?n ?? nghim tr?ng c?n c?p c?u. Khng ch? xem tri?u ch?ng c h?t khng. Hy ?i khm ngay l?p t?c. G?i cho d?ch v? c?p c?u t?i ??a ph??ng (911 ? [place]?). Khng t? li xe ??n b?nh vi?n. Hy cho nhn vin y t? c?p c?u bi?t n?u qu v? ngh? r?ng qu v? nhi?m COVID-19. Tm t?t  COVID-19 l m?t b?nh nhi?m trng ???ng h h?p do vi rt gy ra. B?nh ny cn ???c g?i l b?nh do coronavirus ho?c coronavirus ch?ng m?i. Vi rt ny c th? gy nhi?m trng nghim tr?ng, ch?ng h?n nh? vim ph?i, h?i ch?ng suy h h?p c?p, suy h h?p c?p, ho?c nhi?m trng huy?t.  Vi rt gy COVID-19 d? ly lan. ?i?u ny c ngh?a l vi rt ny ly t? ng??i sang ng??i qua nh?ng gi?t b?n do ht th?, ni chuy?n, ht, ho ho?c h?t h?i.  Qu v? c nhi?u kh? n?ng b? b?nh nghim tr?ng n?u qu v? t? 50 tu?i tr? ln, c h? mi?n d?ch y?u, s?ng trong nh ?i?u d??ng, ho?c c b?nh m?n tnh.  Khng c thu?c no ?  i?u tr? COVID-19. Chuyn gia ch?m [place] s?c kh?e s? trao ??i v?i qu v? v? cch ?i?u tr? cc tri?u ch?ng c?a qu v?.  Th?c hi?n cc b??c ?? b?o v? b?n thn v nh?ng ng??i khc kh?i b? nhi?m b?nh. R?a tay th??ng xuyn v kh? trng cc ?? v?t v cc b? m?t th??ng xuyn ch?m vo m?i ngy. Trnh xa nh?ng ng??i b? ?m v ?eo kh?u trang n?u qu v? b? ?m. Thng tin ny khng nh?m m?c ?ch thay th? cho l?i khuyn m chuyn gia ch?m [place] s?c kh?e ni v?i qu v?. Hy b?o ??m qu v? ph?i th?o lu?n b?t k? v?n ?? g m qu v? c v?i chuyn gia ch?m  s?c kh?e c?a qu v?. Document Revised: 12/23/2018 Document Reviewed: 12/23/2018 Elsevier Patient Education  2020 [employer]. What types of side effects do monoclonal antibody drugs cause?  Common side effects  In general, the more common side effects caused by monoclonal antibody drugs  include: . Allergic reactions, such as hives or itching . Flu-like signs and symptoms, including chills, fatigue, fever, and muscle aches and pains . Nausea, vomiting . Diarrhea . Skin rashes . Low blood pressure   The CDC is recommending patients who receive monoclonal antibody treatments wait at least 90 days before being vaccinated.  Currently, there are no data on the safety and efficacy of mRNA COVID-19 vaccines in persons who received monoclonal antibodies or convalescent plasma as part of COVID-19 treatment. Based on the estimated half-life of such therapies as well as evidence suggesting that reinfection is uncommon in the 90 days after initial infection, vaccination should be deferred for at least 90 days, as a precautionary measure until additional information becomes available, to avoid interference of the antibody treatment with vaccine-induced immune responses.

## 2019-12-19 NOTE — Progress Notes (Signed)
I connected by phone with Theresa Weaver on 12/19/2019 at 10:16 AM to discuss the potential use of a new treatment for mild to moderate COVID-19 viral infection in non-hospitalized patients.  This patient is a 54 y.o. female that meets the FDA criteria for Emergency Use Authorization of COVID monoclonal antibody casirivimab/imdevimab or bamlanivimab/eteseviamb.  Has a (+) direct SARS-CoV-2 viral test result  Has mild or moderate COVID-19   Is NOT hospitalized due to COVID-19  Is within 10 days of symptom onset  Has at least one of the high risk factor(s) for progression to severe COVID-19 and/or hospitalization as defined in EUA.  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 FDA has authorized the emergency use for the treatment of mild to moderate COVID-19 in adults and pediatric patients with positive results of direct SARS-CoV-2 viral testing who are 30 years of age and older weighing at least 40 kg, and who are at high risk for progressing to severe COVID-19 and/or hospitalization.  2. The significant known and potential risks and benefits of COVID monoclonal antibody, and the extent to which such potential risks and benefits are unknown.  3. Information on available alternative treatments and the risks and benefits of those alternatives, including clinical trials.  4. Patients treated with COVID monoclonal antibody should continue to self-isolate and use infection control measures (e.g., wear mask, isolate, social distance, avoid sharing personal items, clean and disinfect "high touch" surfaces, and frequent handwashing) according to CDC guidelines.   5. The patient or parent/caregiver has the option to accept or refuse COVID monoclonal antibody treatment.  After reviewing this information with the patient, the  patient has agreed to receive one of the available covid 19 monoclonal antibodies and will be provided an appropriate fact sheet prior to infusion. Roe Rutherford Keerthana [place], [place] 12/19/2019 10:16 AM

## 2019-12-19 NOTE — Telephone Encounter (Signed)
Called to Discuss with patient about Covid symptoms and the use of the monoclonal antibody infusion for those with mild to moderate Covid symptoms and at a high risk of hospitalization.     Pt appears to qualify for this infusion due to co-morbid conditions and/or a member of an at-risk group in accordance with the FDA Emergency Use Authorization.    Unable to reach pt    

## 2020-03-31 ENCOUNTER — Other Ambulatory Visit: Payer: Self-pay

## 2020-03-31 ENCOUNTER — Encounter (HOSPITAL_COMMUNITY): Payer: Self-pay | Admitting: *Deleted

## 2020-03-31 ENCOUNTER — Ambulatory Visit (HOSPITAL_COMMUNITY)
Admission: EM | Admit: 2020-03-31 | Discharge: 2020-03-31 | Disposition: A | Discharge status: Home / Self Care | Payer: Commercial Managed Care - PPO | Attending: Student | Admitting: Student

## 2020-03-31 DIAGNOSIS — U071 COVID-19: Secondary | ICD-10-CM | POA: Insufficient documentation

## 2020-03-31 DIAGNOSIS — J069 Acute upper respiratory infection, unspecified: Secondary | ICD-10-CM

## 2020-03-31 DIAGNOSIS — Z87891 Personal history of nicotine dependence: Secondary | ICD-10-CM | POA: Insufficient documentation

## 2020-03-31 HISTORY — DX: COVID-19: U07.1

## 2020-03-31 MED ORDER — PROMETHAZINE-DM 6.25-15 MG/5ML PO SYRP: 5.0000 mL | ORAL_SOLUTION | Freq: Four times a day (QID) | ORAL | 0 refills | Status: AC | PRN

## 2020-03-31 MED ORDER — BENZONATATE 100 MG PO CAPS: 100.0000 mg | ORAL_CAPSULE | Freq: Three times a day (TID) | ORAL | 0 refills | Status: AC

## 2020-03-31 NOTE — Discharge Instructions (Addendum)
-  Tessalon as needed for cough. Take one pill up to 3x daily (every 8 hours) -Promethazine DM cough syrup for congestion/cough. This could make you drowsy, so take at night before bed. -For fevers/chills, body aches, headaches- use Tylenol and Ibuprofen. You can alternate these for maximum effect. Use up to 3000mg  Tylenol daily and 3200mg  Ibuprofen daily. Make sure to take ibuprofen with food. Check the bottle of ibuprofen/tylenol for specific dosage instructions.  We are currently awaiting result of your PCR covid-19 test. This typically comes back in 1-2 days. We'll call you if the result is positive. Otherwise, the result will be sent electronically to your MyChart. You can also call this clinic and ask for your result via telephone.   Please isolate at home while awaiting these results. If your test is positive for Covid-19, continue to isolate at home for 5 days if you have mild symptoms, or a total of 10 days from symptom onset if you have more severe symptoms. If you quarantine for a shorter period of time (i.e. 5 days), make sure to wear a mask until day 10 of symptoms. Treat your symptoms at home with OTC remedies like tylenol/ibuprofen, mucinex, nyquil, etc. Seek medical attention if you develop high fevers, chest pain, shortness of breath, ear pain, facial pain, etc. Make sure to get up and move around every 2-3 hours while convalescing to help prevent blood clots. Drink plenty of fluids, and rest as much as possible.

## 2020-03-31 NOTE — ED Triage Notes (Signed)
C/O cough, chills, HA, tactile fever x 2 days.    Reports having Covid in Oct 2021.

## 2020-03-31 NOTE — ED Provider Notes (Signed)
[facility]    CSN: [id number] Arrival date & time: 03/31/20  1058      History   Chief Complaint Chief Complaint  Patient presents with  . Cough  . Fever    HPI Theresa Weaver is a 55 y.o. female Presenting for URI symptoms for 2 days. History of LLQ pain, covid19 12/2019 (3 months ago). Presenting today with cough, chills, headache, subjective fever.  Denies n/v/d, shortness of breath, chest pain,facial pain, teeth pain,, sore throat, loss of taste/smell, swollen lymph nodes, ear pain. Denies worst headache of life, thunderclap headache, weakness/sensation changes in arms/legs, vision changes, shortness of breath, chest pain/pressure, photophobia, phonophobia, n/v/d. Denies chest pain, shortness of breath, confusion, high fevers. This pt had covid19 3 months ago.   HPI  Past Medical History:  Diagnosis Date  . COVID-19     Patient Active Problem List   Diagnosis Date Noted  . Chronic idiopathic constipation 04/24/2019  . Left lower quadrant pain 08/10/2016  . Generalized abdominal pain 08/10/2016  . Right lower quadrant abdominal tenderness without rebound tenderness 08/10/2016    Past Surgical History:  Procedure Laterality Date  . ABDOMINAL HYSTERECTOMY    . TUBAL LIGATION    . WRIST SURGERY Left     OB History   No obstetric history on file.      Home Medications    Prior to Admission medications   Medication Sig Start Date End Date Taking? Authorizing Provider  benzonatate (TESSALON) 100 MG capsule Take 1 capsule (100 mg total) by mouth every 8 (eight) hours. 03/31/20  Yes Rhys Martini, PA-C  IBUPROFEN PO Take by mouth.   Yes [provider]  promethazine-dextromethorphan (PROMETHAZINE-DM) 6.25-15 MG/5ML syrup Take 5 mLs by mouth 4 (four) times daily as needed for cough. 03/31/20  Yes Rhys Martini, PA-C  bacitracin ointment Apply 1 application topically 2 (two) times daily. 08/22/19   Bast, Gloris [place] A, NP  ondansetron (ZOFRAN ODT) 4 MG  disintegrating tablet Take 1 tablet (4 mg total) by mouth every 8 (eight) hours as needed for nausea or vomiting. 12/18/19   Melene Plan, DO  omeprazole (PRILOSEC) 40 MG capsule Take 1 capsule (40 mg total) by mouth daily. Patient not taking: Reported on 04/17/2019 01/17/18 04/25/19  Magdalene River, PA-C    Family History Family History  Family history unknown: Yes    Social History Social History   Tobacco Use  . Smoking status: Former Smoker    Years: 4.00  . Smokeless tobacco: Never Used  . Tobacco comment: aa a teenager  Vaping Use  . Vaping Use: Never used  Substance Use Topics  . Alcohol use: No    Alcohol/week: 0.0 standard drinks  . Drug use: No     Allergies   Patient has no known allergies.   Review of Systems Review of Systems  Constitutional: Positive for chills. Negative for appetite change and fever.  HENT: Positive for congestion. Negative for ear pain, rhinorrhea, sinus pressure, sinus pain and sore throat.   Eyes: Negative for redness and visual disturbance.  Respiratory: Positive for cough. Negative for chest tightness, shortness of breath and wheezing.   Cardiovascular: Negative for chest pain and palpitations.  Gastrointestinal: Negative for abdominal pain, constipation, diarrhea, nausea and vomiting.  Genitourinary: Negative for dysuria, frequency and urgency.  Musculoskeletal: Negative for myalgias.  Neurological: Positive for headaches. Negative for dizziness and weakness.  Psychiatric/Behavioral: Negative for confusion.  All other systems reviewed and are negative.  Physical Exam Triage Vital Signs ED Triage Vitals  Enc Vitals Group     BP 03/31/20 1117 130/85     Pulse Rate 03/31/20 1117 99     Resp 03/31/20 1117 (!) 24     Temp 03/31/20 1117 99.2 F (37.3 C)     Temp Source 03/31/20 1117 Oral     SpO2 03/31/20 1117 98 %     Weight --      Height --      Head Circumference --      Peak Flow --      Pain Score 03/31/20 1119 9      Pain Loc --      Pain Edu? --      Excl. in GC? --    No data found.  Updated Vital Signs BP 130/85   Pulse 99   Temp 99.2 F (37.3 C) (Oral)   Resp (!) 24   SpO2 98%   Visual Acuity Right Eye Distance:   Left Eye Distance:   Bilateral Distance:    Right Eye Near:   Left Eye Near:    Bilateral Near:     Physical Exam Vitals reviewed.  Constitutional:      General: She is not in acute distress.    Appearance: Normal appearance. She is not ill-appearing.  HENT:     Head: Normocephalic and atraumatic.     Right Ear: Hearing, tympanic membrane, ear canal and external ear normal. No swelling or tenderness. There is no impacted cerumen. No mastoid tenderness. Tympanic membrane is not perforated, erythematous, retracted or bulging.     Left Ear: Hearing, tympanic membrane, ear canal and external ear normal. No swelling or tenderness. There is no impacted cerumen. No mastoid tenderness. Tympanic membrane is not perforated, erythematous, retracted or bulging.     Nose:     Right Sinus: No maxillary sinus tenderness or frontal sinus tenderness.     Left Sinus: No maxillary sinus tenderness or frontal sinus tenderness.     Mouth/Throat:     Mouth: Mucous membranes are moist.     Pharynx: Uvula midline. No oropharyngeal exudate or posterior oropharyngeal erythema.     Tonsils: No tonsillar exudate.  Cardiovascular:     Rate and Rhythm: Normal rate and regular rhythm.     Heart sounds: Normal heart sounds.  Pulmonary:     Breath sounds: Normal breath sounds and air entry. No wheezing, rhonchi or rales.  Chest:     Chest wall: No tenderness.  Abdominal:     General: Abdomen is flat. Bowel sounds are normal.     Tenderness: There is no abdominal tenderness. There is no guarding or rebound.  Lymphadenopathy:     Cervical: No cervical adenopathy.  Neurological:     General: No focal deficit present.     Mental Status: She is alert and oriented to person, place, and time.   Psychiatric:        Attention and Perception: Attention and perception normal.        Mood and Affect: Mood and affect normal.        Behavior: Behavior normal. Behavior is cooperative.        Thought Content: Thought content normal.        Judgment: Judgment normal.      UC Treatments / Results  Labs (all labs ordered are listed, but only abnormal results are displayed) Labs Reviewed  SARS CORONAVIRUS 2 (TAT 6-24 HRS)    EKG  Radiology No results found.  Procedures Procedures (including critical care time)  Medications Ordered in UC Medications - No data to display  Initial Impression / Assessment and Plan / UC Course  I have reviewed the triage vital signs and the nursing notes.  Pertinent labs & imaging results that were available during my care of the patient were reviewed by me and considered in my medical decision making (see chart for details).     Covid test sent today.pt had covid19 3 months ago 12/2019. Isolation precautions per CDC guidelines until negative result. Symptomatic relief with OTC Mucinex, Nyquil, etc. Return precautions- new/worsening fevers/chills, shortness of breath, chest pain, abd pain, etc.   Final Clinical Impressions(s) / UC Diagnoses   Final diagnoses:  Viral upper respiratory tract infection  Viral URI with cough     Discharge Instructions     -Tessalon as needed for cough. Take one pill up to 3x daily (every 8 hours) -Promethazine DM cough syrup for congestion/cough. This could make you drowsy, so take at night before bed. -For fevers/chills, body aches, headaches- use Tylenol and Ibuprofen. You can alternate these for maximum effect. Use up to 3000mg  Tylenol daily and 3200mg  Ibuprofen daily. Make sure to take ibuprofen with food. Check the bottle of ibuprofen/tylenol for specific dosage instructions.  We are currently awaiting result of your PCR covid-19 test. This typically comes back in 1-2 days. We'll call you if the result  is positive. Otherwise, the result will be sent electronically to your MyChart. You can also call this clinic and ask for your result via telephone.   Please isolate at home while awaiting these results. If your test is positive for Covid-19, continue to isolate at home for 5 days if you have mild symptoms, or a total of 10 days from symptom onset if you have more severe symptoms. If you quarantine for a shorter period of time (i.e. 5 days), make sure to wear a mask until day 10 of symptoms. Treat your symptoms at home with OTC remedies like tylenol/ibuprofen, mucinex, nyquil, etc. Seek medical attention if you develop high fevers, chest pain, shortness of breath, ear pain, facial pain, etc. Make sure to get up and move around every 2-3 hours while convalescing to help prevent blood clots. Drink plenty of fluids, and rest as much as possible.     ED Prescriptions    Medication Sig Dispense Auth. Provider   benzonatate (TESSALON) 100 MG capsule Take 1 capsule (100 mg total) by mouth every 8 (eight) hours. 21 capsule , PA-C   promethazine-dextromethorphan (PROMETHAZINE-DM) 6.25-15 MG/5ML syrup Take 5 mLs by mouth 4 (four) times daily as needed for cough. 118 mL Rhys Martini, PA-C     PDMP not reviewed this encounter.   05-11-1991, PA-C 03/31/20 1314

## 2020-04-01 ENCOUNTER — Telehealth: Payer: Self-pay

## 2020-04-01 LAB — SARS CORONAVIRUS 2 (TAT 6-24 HRS): SARS Coronavirus 2: POSITIVE — AB

## 2020-04-01 NOTE — Telephone Encounter (Signed)
Called to discuss with patient about COVID-19 symptoms and the use of one of the available treatments for those with mild to moderate Covid symptoms and at a high risk of hospitalization.  Pt appears to qualify for outpatient treatment due to co-morbid conditions and/or a member of an at-risk group in accordance with the FDA Emergency Use Authorization.    Symptom onset: 03/29/20 Vaccinated: No Booster? No Immunocompromised? No  Qualifiers: Yes  Unable to reach pt - Reached pt. Using [place] interpreter # [phone].Pt. states she is better, declines any further treatment.  Theresa Weaver
